# Patient Record
Sex: Male | Born: 1935
Health system: Southern US, Community
[De-identification: ages and names within clinical notes are randomized; demographics above are authoritative.]

## PROBLEM LIST (undated history)

## (undated) DIAGNOSIS — J449 Chronic obstructive pulmonary disease, unspecified: Secondary | ICD-10-CM

## (undated) DIAGNOSIS — J9621 Acute and chronic respiratory failure with hypoxia: Secondary | ICD-10-CM

## (undated) DIAGNOSIS — J690 Pneumonitis due to inhalation of food and vomit: Secondary | ICD-10-CM

## (undated) DIAGNOSIS — R079 Chest pain, unspecified: Secondary | ICD-10-CM

## (undated) DIAGNOSIS — K5641 Fecal impaction: Secondary | ICD-10-CM

## (undated) DIAGNOSIS — K529 Noninfective gastroenteritis and colitis, unspecified: Secondary | ICD-10-CM

## (undated) DIAGNOSIS — N183 Chronic kidney disease, stage 3 (moderate): Secondary | ICD-10-CM

## (undated) DIAGNOSIS — K838 Other specified diseases of biliary tract: Secondary | ICD-10-CM

## (undated) HISTORY — PX: PEG TUBE PLACEMENT: SUR1034

## (undated) HISTORY — PX: OTHER SURGICAL HISTORY: SHX169

---

## 2017-06-02 ENCOUNTER — Other Ambulatory Visit (HOSPITAL_COMMUNITY): Payer: Medicaid Other

## 2017-06-02 ENCOUNTER — Inpatient Hospital Stay
Admission: AC | Admit: 2017-06-02 | Discharge: 2017-07-03 | Disposition: A | Discharge status: Other Acute Inpatient Hospital | Payer: Medicaid Other | Source: Other Acute Inpatient Hospital | Attending: Urology | Admitting: Urology

## 2017-06-02 DIAGNOSIS — J449 Chronic obstructive pulmonary disease, unspecified: Secondary | ICD-10-CM

## 2017-06-02 DIAGNOSIS — J690 Pneumonitis due to inhalation of food and vomit: Secondary | ICD-10-CM

## 2017-06-02 DIAGNOSIS — J9600 Acute respiratory failure, unspecified whether with hypoxia or hypercapnia: Secondary | ICD-10-CM

## 2017-06-02 DIAGNOSIS — N183 Chronic kidney disease, stage 3 unspecified: Secondary | ICD-10-CM

## 2017-06-02 DIAGNOSIS — J9621 Acute and chronic respiratory failure with hypoxia: Secondary | ICD-10-CM

## 2017-06-02 DIAGNOSIS — R4182 Altered mental status, unspecified: Secondary | ICD-10-CM

## 2017-06-02 DIAGNOSIS — Z539 Procedure and treatment not carried out, unspecified reason: Secondary | ICD-10-CM

## 2017-06-02 DIAGNOSIS — J9690 Respiratory failure, unspecified, unspecified whether with hypoxia or hypercapnia: Secondary | ICD-10-CM

## 2017-06-02 DIAGNOSIS — Z431 Encounter for attention to gastrostomy: Secondary | ICD-10-CM

## 2017-06-02 HISTORY — DX: Fecal impaction: K56.41

## 2017-06-02 HISTORY — DX: Chronic obstructive pulmonary disease, unspecified: J44.9

## 2017-06-02 HISTORY — DX: Chest pain, unspecified: R07.9

## 2017-06-02 HISTORY — DX: Noninfective gastroenteritis and colitis, unspecified: K52.9

## 2017-06-02 HISTORY — DX: Acute and chronic respiratory failure with hypoxia: J96.21

## 2017-06-02 HISTORY — DX: Other specified diseases of biliary tract: K83.8

## 2017-06-02 HISTORY — DX: Chronic kidney disease, stage 3 (moderate): N18.3

## 2017-06-02 HISTORY — DX: Pneumonitis due to inhalation of food and vomit: J69.0

## 2017-06-02 LAB — BLOOD GAS, ARTERIAL
ACID-BASE EXCESS: 3.2 mmol/L — AB (ref 0.0–2.0)
BICARBONATE: 26.9 mmol/L (ref 20.0–28.0)
FIO2: 28
LHR: 24 {breaths}/min
O2 Saturation: 95.2 %
PATIENT TEMPERATURE: 98.6
PCO2 ART: 38.8 mmHg (ref 32.0–48.0)
PEEP/CPAP: 5 cmH2O
PO2 ART: 72.5 mmHg — AB (ref 83.0–108.0)
VT: 450 mL
pH, Arterial: 7.455 — ABNORMAL HIGH (ref 7.350–7.450)

## 2017-06-02 MED ORDER — IOPAMIDOL (ISOVUE-300) INJECTION 61%
INTRAVENOUS | Status: AC
  Filled 2017-06-02: qty 50

## 2017-06-03 ENCOUNTER — Other Ambulatory Visit (HOSPITAL_COMMUNITY): Payer: Medicaid Other

## 2017-06-03 ENCOUNTER — Encounter: Payer: Self-pay | Admitting: Internal Medicine

## 2017-06-03 DIAGNOSIS — N183 Chronic kidney disease, stage 3 unspecified: Secondary | ICD-10-CM

## 2017-06-03 DIAGNOSIS — J690 Pneumonitis due to inhalation of food and vomit: Secondary | ICD-10-CM | POA: Diagnosis not present

## 2017-06-03 DIAGNOSIS — J9621 Acute and chronic respiratory failure with hypoxia: Secondary | ICD-10-CM

## 2017-06-03 DIAGNOSIS — J449 Chronic obstructive pulmonary disease, unspecified: Secondary | ICD-10-CM

## 2017-06-03 HISTORY — DX: Chronic obstructive pulmonary disease, unspecified: J44.9

## 2017-06-03 HISTORY — DX: Pneumonitis due to inhalation of food and vomit: J69.0

## 2017-06-03 HISTORY — DX: Chronic kidney disease, stage 3 unspecified: N18.30

## 2017-06-03 HISTORY — DX: Acute and chronic respiratory failure with hypoxia: J96.21

## 2017-06-03 LAB — CBC WITH DIFFERENTIAL/PLATELET
BASOS PCT: 0 %
Band Neutrophils: 7 %
Basophils Absolute: 0 10*3/uL (ref 0.0–0.1)
Blasts: 0 %
EOS PCT: 1 %
Eosinophils Absolute: 0.1 10*3/uL (ref 0.0–0.7)
HCT: 22.6 % — ABNORMAL LOW (ref 39.0–52.0)
Hemoglobin: 7.3 g/dL — ABNORMAL LOW (ref 13.0–17.0)
LYMPHS ABS: 1.3 10*3/uL (ref 0.7–4.0)
LYMPHS PCT: 17 %
MCH: 28.9 pg (ref 26.0–34.0)
MCHC: 32.3 g/dL (ref 30.0–36.0)
MCV: 89.3 fL (ref 78.0–100.0)
MONO ABS: 0.3 10*3/uL (ref 0.1–1.0)
MONOS PCT: 4 %
MYELOCYTES: 1 %
Metamyelocytes Relative: 1 %
NEUTROS ABS: 5.7 10*3/uL (ref 1.7–7.7)
NEUTROS PCT: 69 %
NRBC: 0 /100{WBCs}
OTHER: 0 %
PLATELETS: 183 10*3/uL (ref 150–400)
Promyelocytes Relative: 0 %
RBC: 2.53 MIL/uL — AB (ref 4.22–5.81)
RDW: 15.3 % (ref 11.5–15.5)
WBC: 7.4 10*3/uL (ref 4.0–10.5)

## 2017-06-03 LAB — RENAL FUNCTION PANEL
ANION GAP: 10 (ref 5–15)
Albumin: 2.2 g/dL — ABNORMAL LOW (ref 3.5–5.0)
BUN: 44 mg/dL — ABNORMAL HIGH (ref 6–20)
CALCIUM: 8.1 mg/dL — AB (ref 8.9–10.3)
CO2: 27 mmol/L (ref 22–32)
Chloride: 98 mmol/L — ABNORMAL LOW (ref 101–111)
Creatinine, Ser: 3.15 mg/dL — ABNORMAL HIGH (ref 0.61–1.24)
GFR calc non Af Amer: 17 mL/min — ABNORMAL LOW (ref >60)
GFR, EST AFRICAN AMERICAN: 20 mL/min — AB (ref >60)
Glucose, Bld: 126 mg/dL — ABNORMAL HIGH (ref 65–99)
PHOSPHORUS: 5.1 mg/dL — AB (ref 2.5–4.6)
POTASSIUM: 3.3 mmol/L — AB (ref 3.5–5.1)
SODIUM: 135 mmol/L (ref 135–145)

## 2017-06-03 LAB — URINALYSIS, ROUTINE W REFLEX MICROSCOPIC
BILIRUBIN URINE: NEGATIVE
Glucose, UA: NEGATIVE mg/dL
Ketones, ur: NEGATIVE mg/dL
LEUKOCYTES UA: NEGATIVE
NITRITE: NEGATIVE
PROTEIN: 30 mg/dL — AB
SPECIFIC GRAVITY, URINE: 1.006 (ref 1.005–1.030)
pH: 6 (ref 5.0–8.0)

## 2017-06-03 LAB — CBC
HEMATOCRIT: 23.6 % — AB (ref 39.0–52.0)
HEMOGLOBIN: 7.5 g/dL — AB (ref 13.0–17.0)
MCH: 28.5 pg (ref 26.0–34.0)
MCHC: 31.8 g/dL (ref 30.0–36.0)
MCV: 89.7 fL (ref 78.0–100.0)
Platelets: 190 10*3/uL (ref 150–400)
RBC: 2.63 MIL/uL — AB (ref 4.22–5.81)
RDW: 15.6 % — ABNORMAL HIGH (ref 11.5–15.5)
WBC: 6.8 10*3/uL (ref 4.0–10.5)

## 2017-06-03 LAB — COMPREHENSIVE METABOLIC PANEL
ALT: 10 U/L — AB (ref 17–63)
AST: 22 U/L (ref 15–41)
Albumin: 2.2 g/dL — ABNORMAL LOW (ref 3.5–5.0)
Alkaline Phosphatase: 67 U/L (ref 38–126)
Anion gap: 14 (ref 5–15)
BUN: 42 mg/dL — ABNORMAL HIGH (ref 6–20)
CHLORIDE: 98 mmol/L — AB (ref 101–111)
CO2: 23 mmol/L (ref 22–32)
CREATININE: 3.02 mg/dL — AB (ref 0.61–1.24)
Calcium: 8 mg/dL — ABNORMAL LOW (ref 8.9–10.3)
GFR, EST AFRICAN AMERICAN: 21 mL/min — AB (ref >60)
GFR, EST NON AFRICAN AMERICAN: 18 mL/min — AB (ref >60)
Glucose, Bld: 104 mg/dL — ABNORMAL HIGH (ref 65–99)
POTASSIUM: 3.4 mmol/L — AB (ref 3.5–5.1)
SODIUM: 135 mmol/L (ref 135–145)
Total Bilirubin: 0.7 mg/dL (ref 0.3–1.2)
Total Protein: 5.3 g/dL — ABNORMAL LOW (ref 6.5–8.1)

## 2017-06-03 LAB — C DIFFICILE QUICK SCREEN W PCR REFLEX
C DIFFICLE (CDIFF) ANTIGEN: POSITIVE — AB
C Diff toxin: NEGATIVE

## 2017-06-03 LAB — CK TOTAL AND CKMB (NOT AT ARMC)
CK TOTAL: 17 U/L — AB (ref 49–397)
CK, MB: 0.7 ng/mL (ref 0.5–5.0)
Relative Index: INVALID (ref 0.0–2.5)

## 2017-06-03 LAB — CLOSTRIDIUM DIFFICILE BY PCR, REFLEXED: Toxigenic C. Difficile by PCR: NEGATIVE

## 2017-06-03 LAB — PROTIME-INR
INR: 1.17
PROTHROMBIN TIME: 14.8 s (ref 11.4–15.2)

## 2017-06-03 LAB — TROPONIN I

## 2017-06-03 LAB — MAGNESIUM: Magnesium: 1.9 mg/dL (ref 1.7–2.4)

## 2017-06-03 MED ORDER — NYSTATIN 100000 UNIT/ML MT SUSP
500000.00 | OROMUCOSAL | Status: DC
Start: 2017-06-02 — End: 2017-06-03

## 2017-06-03 MED ORDER — ASPIRIN 81 MG PO CHEW
81.00 | CHEWABLE_TABLET | ORAL | Status: DC
Start: 2017-06-03 — End: 2017-06-03

## 2017-06-03 MED ORDER — ALBUTEROL SULFATE (2.5 MG/3ML) 0.083% IN NEBU
2.50 | INHALATION_SOLUTION | RESPIRATORY_TRACT | Status: DC
Start: ? — End: 2017-06-03

## 2017-06-03 MED ORDER — GLUCOSE 40 % PO GEL
15.00 g | ORAL | Status: DC
Start: ? — End: 2017-06-03

## 2017-06-03 MED ORDER — DEXTROSE 50 % IV SOLN
12.00 g | INTRAVENOUS | Status: DC
Start: ? — End: 2017-06-03

## 2017-06-03 MED ORDER — GENERIC EXTERNAL MEDICATION
1.00 g | Status: DC
Start: 2017-06-02 — End: 2017-06-03

## 2017-06-03 MED ORDER — PANTOPRAZOLE 40 MG/20 ML SUSPENSION
40.00 | PACK | ORAL | Status: DC
Start: 2017-06-03 — End: 2017-06-03

## 2017-06-03 MED ORDER — TAMSULOSIN HCL 0.4 MG PO CAPS
.40 | ORAL_CAPSULE | ORAL | Status: DC
Start: 2017-06-03 — End: 2017-06-03

## 2017-06-03 MED ORDER — GENERIC EXTERNAL MEDICATION
1.00 | Status: DC
Start: 2017-06-03 — End: 2017-06-03

## 2017-06-03 MED ORDER — ACETAMINOPHEN 650 MG/20.3ML PO SOLN
650.00 | ORAL | Status: DC
Start: ? — End: 2017-06-03

## 2017-06-03 MED ORDER — NITROGLYCERIN 0.4 MG SL SUBL
0.40 | SUBLINGUAL_TABLET | SUBLINGUAL | Status: DC
Start: ? — End: 2017-06-03

## 2017-06-03 MED ORDER — GENERIC EXTERNAL MEDICATION
Status: DC
Start: 2017-06-03 — End: 2017-06-03

## 2017-06-03 MED ORDER — GENERIC EXTERNAL MEDICATION
10.00 | Status: DC
Start: 2017-06-02 — End: 2017-06-03

## 2017-06-03 MED ORDER — INSULIN LISPRO 100 UNIT/ML ~~LOC~~ SOLN
2.00 | SUBCUTANEOUS | Status: DC
Start: 2017-06-02 — End: 2017-06-03

## 2017-06-03 MED ORDER — MONTELUKAST SODIUM 10 MG PO TABS
10.00 | ORAL_TABLET | ORAL | Status: DC
Start: 2017-06-03 — End: 2017-06-03

## 2017-06-03 MED ORDER — HYDROCORTISONE NA SUCCINATE PF 100 MG IJ SOLR
50.00 | INTRAMUSCULAR | Status: DC
Start: 2017-06-03 — End: 2017-06-03

## 2017-06-03 MED ORDER — GENERIC EXTERNAL MEDICATION
1.00 | Status: DC
Start: ? — End: 2017-06-03

## 2017-06-03 MED ORDER — EPOETIN ALFA 20000 UNIT/ML IJ SOLN
20000.00 | INTRAMUSCULAR | Status: DC
Start: 2017-06-09 — End: 2017-06-03

## 2017-06-03 MED ORDER — GENERIC EXTERNAL MEDICATION
Status: DC
Start: ? — End: 2017-06-03

## 2017-06-03 MED ORDER — DEXTROSE 50 % IV SOLN
50.00 | INTRAVENOUS | Status: DC
Start: ? — End: 2017-06-03

## 2017-06-03 MED ORDER — NICOTINE 7 MG/24HR TD PT24
1.00 | MEDICATED_PATCH | TRANSDERMAL | Status: DC
Start: 2017-06-03 — End: 2017-06-03

## 2017-06-03 MED ORDER — MUPIROCIN 2 % EX OINT
TOPICAL_OINTMENT | CUTANEOUS | Status: DC
Start: 2017-06-03 — End: 2017-06-03

## 2017-06-03 NOTE — Consult Note (Signed)
[facility] CONSULT NOTE    Date: 06/03/2017                  Patient Name:  Mark Farrell  MRN: [id number]  DOB: [birth year]  Age / Sex: 83 y.o., male         PCP: No primary care provider on file.                 Service Requesting Consult: Hospitalist                 Reason for Consult: Acute renal failure requiring dialysis            History of Present Illness: Patient is a 83 y.o. male with a recent PMHx of acute renal failure on chronic kidney disease stage III requiring hemodialysis status post PermCath placement on June 02, 2017,  acute hypoxic respiratory failure status post tracheostomy placement May 29, 2017, dysphagia status post PEG, recurrent aspiration pneumonia, diabetes mellitus type 2, COPD, hypertension, who was admitted to [facility] on 06/02/2017 for ongoing management.  Patient was admitted to [facility] from April 28, 2017 to June 02, 2017.  Patient unable to offer any history at this point in time.  When he initially presented he was having falls at home.  He was also found to have colitis, aspiration pneumonia, and fecal impaction.  Currently he is awake and alert and will follow simple commands.  Overall he was felt to have a guarded prognosis long-term.  Immediately prior to his discharge he was still requiring antibiotic therapy with meropenem.  He has also been receiving tube feeds and has been tolerating these relatively well.  Medications: Medications upon discharge from [facility]:  . amino acids-protein hydrolysate Per G Tube Daily@0800   . aspirin 81 mg Per G Tube Daily@0900   . epoetin alfa 20,000 Units Subcutaneous Q7 Days  . hydrocortisone sodium succinate 50 mg Intravenous Q24H  . insulin glargine 10 Units Subcutaneous QPM  . insulin lispro 2-12 Units Subcutaneous 6 times per day  . lactobacillus rhamnosus (GG) 1 capsule Oral Daily@0900   . meropenem 1 g Intravenous Q24H  .  montelukast 10 mg Oral Daily@1800   . mupirocin Topical Daily@0900   . nicotine 1 patch Transdermal Daily@0900   . nystatin 500,000 Units Oral Q6H  . pantoprazole 40 mg Per G Tube Daily@0900   . tamsulosin 0.4 mg Oral Daily@0900      Allergies: No known drug allergies   Past Medical History: acute renal failure on chronic kidney disease stage III requiring hemodialysis status post PermCath placement on June 02, 2017,  acute hypoxic respiratory failure status post tracheostomy placement May 29, 2017, dysphagia status post PEG, recurrent aspiration pneumonia, diabetes mellitus type 2, COPD, hypertension,  Past Surgical History: Tracheostomy placement May 29, 2017 PermCath placement June 02, 2017 Status post PEG tube placement  Family History: Unable to obtain from the patient as he is currently on the ventilator.  Social History: Unable to obtain from the patient as he is currently on the ventilator.  Review of Systems: Unable to obtain from the patient as he is currently on the ventilator.In morning  Vital Signs: Temperature 97.6 pulse 93 respirations 24 blood pressure 105/52  Weight trends: There were no vitals filed for this visit.  Physical Exam: General: Chronically ill appearing  Head: Normocephalic, atraumatic.  Eyes: Anicteric, EOMI  Nose: Mucous membranes moist, not inflammed, nonerythematous.  Throat: Oropharynx nonerythematous, no exudate appreciated.   Neck: Tracheostomy  in place  Lungs:  Ventilator assisted,bilateral rhonchi  Heart: S1S2 no rubs  Abdomen:  Soft NTND BS present, PEG present  Extremities: trace pretibial edema.  Neurologic: Awake and alert, will follow commands  Skin: Scattered ecchymoses    Lab results: Basic Metabolic Panel: No results for input(s): NA, K, CL, CO2, GLUCOSE, BUN, CREATININE, CALCIUM, MG, PHOS in the last 168 hours.  Liver Function Tests: No results for input(s): AST, ALT, ALKPHOS, BILITOT, PROT, ALBUMIN in the last  168 hours. No results for input(s): LIPASE, AMYLASE in the last 168 hours. No results for input(s): AMMONIA in the last 168 hours.  CBC: Recent Labs  Lab 06/03/17 0532  WBC 7.4  NEUTROABS PENDING  HGB 7.3*  HCT 22.6*  MCV 89.3  PLT 183    Cardiac Enzymes: No results for input(s): CKTOTAL, CKMB, CKMBINDEX, TROPONINI in the last 168 hours.  BNP: Invalid input(s): POCBNP  CBG: No results for input(s): GLUCAP in the last 168 hours.  Microbiology: No results found for this or any previous visit.  Coagulation Studies: Recent Labs    06/03/17 0532  LABPROT 14.8  INR 1.17    Urinalysis: Recent Labs    06/03/17 0625  COLORURINE YELLOW  LABSPEC 1.006  PHURINE 6.0  GLUCOSEU NEGATIVE  HGBUR MODERATE*  BILIRUBINUR NEGATIVE  KETONESUR NEGATIVE  PROTEINUR 30*  NITRITE NEGATIVE  LEUKOCYTESUR NEGATIVE      Imaging: Dg Abdomen Peg Tube Location  Result Date: 06/02/2017 CLINICAL DATA:  PEG tube adjustment. EXAM: ABDOMEN - 1 VIEW COMPARISON:  None. FINDINGS: The patient was injected with 50 mL of Isovue-300 through the PEG tube. The PEG tube distal tip terminates over the gastric body. There is filling of the stomach, duodenum, and proximal jejunum. No evidence of leak. An IVC filter is identified. IMPRESSION: Peg tube placement as above.  No evidence of leak. Electronically Signed   By: Gerome Samavid  Williams III M.D   On: 06/02/2017 20:29      Assessment & Plan: Pt is a 83 y.o. male with a recent PMHx of acute renal failure on chronic kidney disease stage III requiring hemodialysis status post PermCath placement on June 02, 2017,  acute hypoxic respiratory failure status post tracheostomy placement May 29, 2017, dysphagia status post PEG, recurrent aspiration pneumonia, diabetes mellitus type 2, COPD, hypertension, who was admitted to [facility] on 06/02/2017 for ongoing management.    1.  Acute renal failure requiring hemodialysis. 2.  Chronic kidney disease stage  III baseline creatinine 1.3. 3.  Acute respiratory failure status post tracheostomy placement. 4.  Recurrent aspiration pneumonia. 5.  Anemia of chronic kidney disease.  Plan: The patient had a [facility].  We are asked to see him for continued management of his acute renal failure.  We will plan for hemodialysis today.  Thereafter we will maintain him on a Monday, Wednesday, Friday schedule.  He has a right internal jugular PermCath in place.  His hemoglobin is noted to be low at 7.3.  We will start the patient on Epogen 10,000 units IV with dialysis.  Treatment of aspiration pneumonia per hospitalist.  More than likely he will also be seen by neurology for weaning from the ventilator.  Overall prognosis is quite guarded.

## 2017-06-03 NOTE — Consult Note (Signed)
Pulmonary Critical Care Medicine [facility] PULMONARY SERVICE  Date of Service: 06/03/2017  PULMONARY CONSULT   Mark Farrell  XBM:[id number]  DOB: 08-Dec-1934   DOA: 06/02/2017  Referring Physician: Carron Curie, MD  HPI: Mark Farrell is a 83 y.o. male seen for follow up of Acute on Chronic Respiratory Failure.  Patient is transferred for further management.  Basically presented with history of falling history of aspiration pneumonia fecal impaction and colitis.  Patient was evaluated at the transferring facility started on broad-spectrum antibiotics and on the ventilator.  Patient was found to have pneumonia on x-ray was treated as such.  Had palliative care consultation however the family wanted to keep him as a full code at that time.  Patient had initially been tried on BiPAP however did not tolerate it.  Eventually had to be placed on the ventilator.  Comes to [place] on assist control mode and 28% oxygen.  Review of Systems:  ROS performed and is unremarkable other than noted above.  Past Medical History:  Diagnosis Date  . Acute on chronic respiratory failure with hypoxia (HCC) 06/03/2017  . Aspiration pneumonia of both lower lobes due to gastric secretions (HCC) 06/03/2017  . Chest pain   . CKD (chronic kidney disease), stage III (HCC) 06/03/2017  . Colitis   . COPD with chronic bronchitis (HCC) 06/03/2017  . Fecal impaction (HCC)   . Pneumobilia     Past Surgical History:  Procedure Laterality Date  . Hemodialysis catheter    . PEG TUBE PLACEMENT      Social History:    has an unknown smoking status. He has never used smokeless tobacco. He reports that he drank alcohol. His drug history is not on file.  Family History: Non-Contributory to the present illness  Allergies not on file  Medications: Reviewed on Rounds  Physical Exam:  Vitals: Temperature 97.6 pulse 93 respiratory rate 24 blood pressure 105/62 saturations 98%  Ventilator Settings of  ventilation assist control FiO2 28% tidal volume 465 PEEP 5  . General: Comfortable at this time . Eyes: Grossly normal lids, irises & conjunctiva . ENT: grossly tongue is normal . Neck: no obvious mass . Cardiovascular: S1-S2 normal no gallop or rub . Respiratory: Coarse breath sounds scattered rhonchi expansion is equal . Abdomen: Soft and nontender . Skin: no rash seen on limited exam . Musculoskeletal: not rigid . Psychiatric:unable to assess . Neurologic: no seizure no involuntary movements         Labs on Admission:  Basic Metabolic Panel: Recent Labs  Lab 06/03/17 0532 06/03/17 0757  NA 135 135  K 3.4* 3.3*  CL 98* 98*  CO2 23 27  GLUCOSE 104* 126*  BUN 42* 44*  CREATININE 3.02* 3.15*  CALCIUM 8.0* 8.1*  MG 1.9  --   PHOS  --  5.1*    Liver Function Tests: Recent Labs  Lab 06/03/17 0532 06/03/17 0757  AST 22  --   ALT 10*  --   ALKPHOS 67  --   BILITOT 0.7  --   PROT 5.3*  --   ALBUMIN 2.2* 2.2*   No results for input(s): LIPASE, AMYLASE in the last 168 hours. No results for input(s): AMMONIA in the last 168 hours.  CBC: Recent Labs  Lab 06/03/17 0532 06/03/17 0757  WBC 7.4 6.8  NEUTROABS 5.7  --   HGB 7.3* 7.5*  HCT 22.6* 23.6*  MCV 89.3 89.7  PLT 183 190    Cardiac Enzymes: No  results for input(s): CKTOTAL, CKMB, CKMBINDEX, TROPONINI in the last 168 hours.  BNP (last 3 results) No results for input(s): BNP in the last 8760 hours.  ProBNP (last 3 results) No results for input(s): PROBNP in the last 8760 hours.   Radiological Exams on Admission: Dg Abdomen Peg Tube Location  Result Date: 06/02/2017 CLINICAL DATA:  PEG tube adjustment. EXAM: ABDOMEN - 1 VIEW COMPARISON:  None. FINDINGS: The patient was injected with 50 mL of Isovue-300 through the PEG tube. The PEG tube distal tip terminates over the gastric body. There is filling of the stomach, duodenum, and proximal jejunum. No evidence of leak. An IVC filter is identified.  IMPRESSION: Peg tube placement as above.  No evidence of leak. Electronically Signed   By: Gerome Samavid  Williams III M.D   On: 06/02/2017 20:29   Dg Chest Port 1 View  Result Date: 06/03/2017 CLINICAL DATA:  Respiratory failure. EXAM: PORTABLE CHEST 1 VIEW COMPARISON:  05/30/2017 FINDINGS: Tracheostomy tube overlies the airway. Pre-existing right jugular catheter has been removed. A tunneled right jugular dialysis catheter has been placed and terminates over the high right atrium. Cardiomediastinal silhouette is within normal limits. Mild interstitial densities remain throughout both lungs. Residual patchy right basilar airspace opacities on the prior study have resolved. No sizable pleural effusion or pneumothorax is identified. IMPRESSION: 1. Resolution of residual right basilar airspace opacities. 2. Suspected mild residual interstitial edema. 3. Interval tunneled right jugular dialysis catheter placement. Electronically Signed   By: Sebastian AcheAllen  Grady M.D.   On: 06/03/2017 11:18    Assessment/Plan Active Problems:   Acute on chronic respiratory failure with hypoxia (HCC)   Aspiration pneumonia of both lower lobes due to gastric secretions (HCC)   CKD (chronic kidney disease), stage III (HCC)   COPD with chronic bronchitis (HCC)   1. Acute on chronic respiratory failure with hypoxia patient is on full vent support we will have respiratory assist the hours as needed mechanics and try to wean if possible.  We will continue with supportive care 2. Pneumonia due to aspiration treated with antibiotics we will continue to follow along 3. Chronic kidney disease stage III on dialysis nephrology consultation 4. Chronic obstructive pulmonary disease we will continue with present management  I have personally seen and evaluated the patient, evaluated laboratory and imaging results, formulated the assessment and plan and placed orders. The Patient requires high complexity decision making for assessment and support.   Case was discussed on Rounds with the Respiratory Therapy Staff Time Spent 70minutes  Yevonne PaxSaadat A Jarvis Knodel, MD [facility] Pulmonary Critical Care Medicine Sleep Medicine

## 2017-06-04 DIAGNOSIS — J449 Chronic obstructive pulmonary disease, unspecified: Secondary | ICD-10-CM | POA: Diagnosis not present

## 2017-06-04 DIAGNOSIS — J9621 Acute and chronic respiratory failure with hypoxia: Secondary | ICD-10-CM | POA: Diagnosis not present

## 2017-06-04 DIAGNOSIS — N183 Chronic kidney disease, stage 3 (moderate): Secondary | ICD-10-CM | POA: Diagnosis not present

## 2017-06-04 DIAGNOSIS — J690 Pneumonitis due to inhalation of food and vomit: Secondary | ICD-10-CM | POA: Diagnosis not present

## 2017-06-04 LAB — URINE CULTURE: Culture: NO GROWTH

## 2017-06-04 LAB — HEPATITIS B CORE ANTIBODY, TOTAL: Hep B Core Total Ab: NEGATIVE

## 2017-06-04 LAB — CBC
HEMATOCRIT: 23 % — AB (ref 39.0–52.0)
HEMOGLOBIN: 7.5 g/dL — AB (ref 13.0–17.0)
MCH: 28.8 pg (ref 26.0–34.0)
MCHC: 32.6 g/dL (ref 30.0–36.0)
MCV: 88.5 fL (ref 78.0–100.0)
Platelets: 220 10*3/uL (ref 150–400)
RBC: 2.6 MIL/uL — AB (ref 4.22–5.81)
RDW: 15.3 % (ref 11.5–15.5)
WBC: 9.9 10*3/uL (ref 4.0–10.5)

## 2017-06-04 LAB — RENAL FUNCTION PANEL
ANION GAP: 11 (ref 5–15)
Albumin: 2.1 g/dL — ABNORMAL LOW (ref 3.5–5.0)
BUN: 59 mg/dL — ABNORMAL HIGH (ref 6–20)
CALCIUM: 8.2 mg/dL — AB (ref 8.9–10.3)
CHLORIDE: 97 mmol/L — AB (ref 101–111)
CO2: 26 mmol/L (ref 22–32)
Creatinine, Ser: 3.83 mg/dL — ABNORMAL HIGH (ref 0.61–1.24)
GFR calc non Af Amer: 14 mL/min — ABNORMAL LOW (ref >60)
GFR, EST AFRICAN AMERICAN: 16 mL/min — AB (ref >60)
Glucose, Bld: 84 mg/dL (ref 65–99)
POTASSIUM: 3.5 mmol/L (ref 3.5–5.1)
Phosphorus: 4.9 mg/dL — ABNORMAL HIGH (ref 2.5–4.6)
SODIUM: 134 mmol/L — AB (ref 135–145)

## 2017-06-04 LAB — HEPATITIS B SURFACE ANTIGEN: HEP B S AG: NEGATIVE

## 2017-06-04 LAB — PARATHYROID HORMONE, INTACT (NO CA): PTH: 33 pg/mL (ref 15–65)

## 2017-06-04 LAB — POTASSIUM: Potassium: 3.7 mmol/L (ref 3.5–5.1)

## 2017-06-04 LAB — HEPATITIS B SURFACE ANTIBODY,QUALITATIVE: HEP B S AB: NONREACTIVE

## 2017-06-04 NOTE — Progress Notes (Signed)
Pulmonary Critical Care Medicine [facility]   PULMONARY SERVICE  PROGRESS NOTE  Date of Service: 06/04/2017  Mark Farrell  ZOX:[id number]  DOB: 10-04-1934   DOA: 06/02/2017  Referring Physician: Carron Curie, MD  HPI: Mark Farrell is a 83 y.o. male seen for follow up of Acute on Chronic Respiratory Failure. Patient is on full support right has been on assist-control mode 28% oxygen good saturations are noted.  Currently is on a peep of 5  Medications: Reviewed on Rounds  Physical Exam:  Vitals:  Temperature 98.0 degrees pulse 86 blood pressure 120/62 saturations 98%  Ventilator Settings  Mode of ventilation assist-control FiO2 28% tidal volume 450 peep 5  . General: Comfortable at this time . Eyes: Grossly normal lids, irises & conjunctiva . ENT: grossly tongue is normal . Neck: no obvious mass . Cardiovascular:  S1-S2 normal no gallop noted . Respiratory:  No rhonchi expansion is equal . Abdomen:  Soft nontender . Skin: no rash seen on limited exam . Musculoskeletal: not rigid . Psychiatric:unable to assess . Neurologic: no seizure no involuntary movements         Labs on Admission:  Basic Metabolic Panel: Recent Labs  Lab 06/03/17 0532 06/03/17 0757 06/04/17 0445 06/04/17 1019  NA 135 135  --  134*  K 3.4* 3.3* 3.7 3.5  CL 98* 98*  --  97*  CO2 23 27  --  26  GLUCOSE 104* 126*  --  84  BUN 42* 44*  --  59*  CREATININE 3.02* 3.15*  --  3.83*  CALCIUM 8.0* 8.1*  --  8.2*  MG 1.9  --   --   --   PHOS  --  5.1*  --  4.9*    Liver Function Tests: Recent Labs  Lab 06/03/17 0532 06/03/17 0757 06/04/17 1019  AST 22  --   --   ALT 10*  --   --   ALKPHOS 67  --   --   BILITOT 0.7  --   --   PROT 5.3*  --   --   ALBUMIN 2.2* 2.2* 2.1*   No results for input(s): LIPASE, AMYLASE in the last 168 hours. No results for input(s): AMMONIA in the last 168 hours.  CBC: Recent Labs  Lab 06/03/17 0532 06/03/17 0757 06/04/17 1019   WBC 7.4 6.8 9.9  NEUTROABS 5.7  --   --   HGB 7.3* 7.5* 7.5*  HCT 22.6* 23.6* 23.0*  MCV 89.3 89.7 88.5  PLT 183 190 220    Cardiac Enzymes: Recent Labs  Lab 06/03/17 1443  CKTOTAL 17*  CKMB 0.7  TROPONINI <0.03    BNP (last 3 results) No results for input(s): BNP in the last 8760 hours.  ProBNP (last 3 results) No results for input(s): PROBNP in the last 8760 hours.  Radiological Exams on Admission: Dg Abdomen Peg Tube Location  Result Date: 06/02/2017 CLINICAL DATA:  PEG tube adjustment. EXAM: ABDOMEN - 1 VIEW COMPARISON:  None. FINDINGS: The patient was injected with 50 mL of Isovue-300 through the PEG tube. The PEG tube distal tip terminates over the gastric body. There is filling of the stomach, duodenum, and proximal jejunum. No evidence of leak. An IVC filter is identified. IMPRESSION: Peg tube placement as above.  No evidence of leak. Electronically Signed   By: Gerome Sam III M.D   On: 06/02/2017 20:29   Dg Chest Port 1 View  Result Date: 06/03/2017 CLINICAL DATA:  Respiratory failure. EXAM: PORTABLE CHEST 1 VIEW COMPARISON:  05/30/2017 FINDINGS: Tracheostomy tube overlies the airway. Pre-existing right jugular catheter has been removed. A tunneled right jugular dialysis catheter has been placed and terminates over the high right atrium. Cardiomediastinal silhouette is within normal limits. Mild interstitial densities remain throughout both lungs. Residual patchy right basilar airspace opacities on the prior study have resolved. No sizable pleural effusion or pneumothorax is identified. IMPRESSION: 1. Resolution of residual right basilar airspace opacities. 2. Suspected mild residual interstitial edema. 3. Interval tunneled right jugular dialysis catheter placement. Electronically Signed   By: Sebastian AcheAllen  Grady M.D.   On: 06/03/2017 11:18    Assessment/Plan Active Problems:   Acute on chronic respiratory failure with hypoxia (HCC)   Aspiration pneumonia of both lower  lobes due to gastric secretions (HCC)   CKD (chronic kidney disease), stage III (HCC)   COPD with chronic bronchitis (HCC)   1.  acute on chronic Respiratory failure with hypoxia at this time patient will be started on wean protocol.  We will titrate rate down switch over to pressure support as tolerated continue with secretion management pulmonary toilet and follow along 2. Pneumonia secondary to aspiration treated with antibiotics we will continue with supportive care follow the x-rays.   3. Chronic kidney disease stage 3 follow labs and continue with supportive care 4. Chronic obstructive pulmonary disease at baseline right now seems to be stable Will monitor   I have personally seen and evaluated the patient, evaluated laboratory and imaging results, formulated the assessment and plan and placed orders. The Patient requires high complexity decision making for assessment and support.  Case was discussed on Rounds with the Respiratory Therapy Staff  Yevonne PaxSaadat A Khan, MD [employer] [facility] Pulmonary Critical Care Medicine Sleep Medicine

## 2017-06-05 DIAGNOSIS — J9621 Acute and chronic respiratory failure with hypoxia: Secondary | ICD-10-CM | POA: Diagnosis not present

## 2017-06-05 DIAGNOSIS — J449 Chronic obstructive pulmonary disease, unspecified: Secondary | ICD-10-CM | POA: Diagnosis not present

## 2017-06-05 DIAGNOSIS — N183 Chronic kidney disease, stage 3 (moderate): Secondary | ICD-10-CM | POA: Diagnosis not present

## 2017-06-05 DIAGNOSIS — J690 Pneumonitis due to inhalation of food and vomit: Secondary | ICD-10-CM | POA: Diagnosis not present

## 2017-06-05 LAB — RENAL FUNCTION PANEL
ALBUMIN: 2 g/dL — AB (ref 3.5–5.0)
ANION GAP: 11 (ref 5–15)
BUN: 37 mg/dL — AB (ref 6–20)
CO2: 25 mmol/L (ref 22–32)
Calcium: 8.2 mg/dL — ABNORMAL LOW (ref 8.9–10.3)
Chloride: 99 mmol/L — ABNORMAL LOW (ref 101–111)
Creatinine, Ser: 2.55 mg/dL — ABNORMAL HIGH (ref 0.61–1.24)
GFR calc Af Amer: 26 mL/min — ABNORMAL LOW (ref >60)
GFR calc non Af Amer: 23 mL/min — ABNORMAL LOW (ref >60)
GLUCOSE: 154 mg/dL — AB (ref 65–99)
PHOSPHORUS: 3.6 mg/dL (ref 2.5–4.6)
POTASSIUM: 3.4 mmol/L — AB (ref 3.5–5.1)
SODIUM: 135 mmol/L (ref 135–145)

## 2017-06-05 LAB — CBC
HEMATOCRIT: 23.8 % — AB (ref 39.0–52.0)
Hemoglobin: 7.5 g/dL — ABNORMAL LOW (ref 13.0–17.0)
MCH: 28.3 pg (ref 26.0–34.0)
MCHC: 31.5 g/dL (ref 30.0–36.0)
MCV: 89.8 fL (ref 78.0–100.0)
Platelets: 229 10*3/uL (ref 150–400)
RBC: 2.65 MIL/uL — ABNORMAL LOW (ref 4.22–5.81)
RDW: 15.8 % — AB (ref 11.5–15.5)
WBC: 5 10*3/uL (ref 4.0–10.5)

## 2017-06-05 NOTE — Progress Notes (Signed)
[facility]  ROUNDING NOTE   Subjective:   Moving to room 15  Hemodialysis yesterday. Tolerated treatment well. UF of 2 liters.   Hemodialysis for later today.   Objective:  Vital signs in last 24 hours:  BP: ()/()  Arterial Line BP: ()/()   Weight change:  There were no vitals filed for this visit.  Intake/Output: No intake/output data recorded.   Intake/Output this shift:  No intake/output data recorded.  Physical Exam: General: Ill appearing  Head: Normocephalic, atraumatic. Moist oral mucosal membranes  Eyes: Anicteric, PERRL  Neck: tracheostomy  Lungs:  Ronchi, vent A/C FiO2 35%  Heart: Regular rate and rhythm  Abdomen:  Soft, nontender, +PEG  Extremities: no peripheral edema.  Neurologic: Nonfocal, moving all four extremities  Skin: No lesions  Access: RIJ permcath    Basic Metabolic Panel: Recent Labs  Lab 06/03/17 0532 06/03/17 0757 06/04/17 0445 06/04/17 1019 06/05/17 0628  NA 135 135  --  134* 135  K 3.4* 3.3* 3.7 3.5 3.4*  CL 98* 98*  --  97* 99*  CO2 23 27  --  26 25  GLUCOSE 104* 126*  --  84 154*  BUN 42* 44*  --  59* 37*  CREATININE 3.02* 3.15*  --  3.83* 2.55*  CALCIUM 8.0* 8.1*  --  8.2* 8.2*  MG 1.9  --   --   --   --   PHOS  --  5.1*  --  4.9* 3.6    Liver Function Tests: Recent Labs  Lab 06/03/17 0532 06/03/17 0757 06/04/17 1019 06/05/17 0628  AST 22  --   --   --   ALT 10*  --   --   --   ALKPHOS 67  --   --   --   BILITOT 0.7  --   --   --   PROT 5.3*  --   --   --   ALBUMIN 2.2* 2.2* 2.1* 2.0*   No results for input(s): LIPASE, AMYLASE in the last 168 hours. No results for input(s): AMMONIA in the last 168 hours.  CBC: Recent Labs  Lab 06/03/17 0532 06/03/17 0757 06/04/17 1019 06/05/17 0628  WBC 7.4 6.8 9.9 5.0  NEUTROABS 5.7  --   --   --   HGB 7.3* 7.5* 7.5* 7.5*  HCT 22.6* 23.6* 23.0* 23.8*  MCV 89.3 89.7 88.5 89.8  PLT 183 190 220 229    Cardiac Enzymes: Recent Labs  Lab  06/03/17 1443  CKTOTAL 17*  CKMB 0.7  TROPONINI <0.03    BNP: Invalid input(s): POCBNP  CBG: No results for input(s): GLUCAP in the last 168 hours.  Microbiology: Results for orders placed or performed during the hospital encounter of 06/02/17  C difficile quick scan w PCR reflex     Status: Abnormal   Collection Time: 06/03/17  6:25 AM  Result Value Ref Range Status   C Diff antigen POSITIVE (A) NEGATIVE Final   C Diff toxin NEGATIVE NEGATIVE Final   C Diff interpretation Results are indeterminate. See PCR results.  Final    Comment: Performed at [facility], [address] [place], [place] [address]  Culture, Urine     Status: None   Collection Time: 06/03/17  6:25 AM  Result Value Ref Range Status   Specimen Description URINE, RANDOM  Final   Special Requests NONE  Final   Culture   Final    NO GROWTH Performed at [facility], [address] [place], [place] [address]    Report Status 06/04/2017 FINAL  Final  C. Diff by PCR, Reflexed     Status: None   Collection Time: 06/03/17  8:09 AM  Result Value Ref Range Status   Toxigenic C. Difficile by PCR NEGATIVE NEGATIVE Final    Comment: Patient is colonized with non toxigenic C. difficile. May not need treatment unless significant symptoms are present. Performed at [facility], [address] [place], [place] [address]   Culture, respiratory (NON-Expectorated)     Status: None (Preliminary result)   Collection Time: 06/04/17 10:15 AM  Result Value Ref Range Status   Specimen Description TRACHEAL ASPIRATE  Final   Special Requests NONE  Final   Gram Stain   Final    RARE WBC PRESENT, PREDOMINANTLY MONONUCLEAR RARE SQUAMOUS EPITHELIAL CELLS PRESENT NO ORGANISMS SEEN    Culture   Final    CULTURE REINCUBATED FOR BETTER GROWTH Performed at [facility], [address] [place], [place] [address]    Report Status PENDING  Incomplete    Coagulation Studies: Recent Labs     06/03/17 0532  LABPROT 14.8  INR 1.17    Urinalysis: Recent Labs    06/03/17 0625  COLORURINE YELLOW  LABSPEC 1.006  PHURINE 6.0  GLUCOSEU NEGATIVE  HGBUR MODERATE*  BILIRUBINUR NEGATIVE  KETONESUR NEGATIVE  PROTEINUR 30*  NITRITE NEGATIVE  LEUKOCYTESUR NEGATIVE      Imaging: No results found.   Medications:       Assessment/ Plan:  Pt is a 83 y.o. white male with a recent PMHx of acute renal failure on chronic kidney disease stage III requiring hemodialysis status post PermCath placement on June 02, 2017,  acute hypoxic respiratory failure status post tracheostomy placement May 29, 2017, dysphagia status post PEG, recurrent aspiration pneumonia, diabetes mellitus type 2, COPD, hypertension, who was admitted to [facility] on 06/02/2017 for ongoing management.   1.  Acute renal failure requiring hemodialysis. 2.  Chronic kidney disease stage III baseline creatinine 1.3. 3.  Acute respiratory failure status post tracheostomy placement. 4.  Recurrent aspiration pneumonia. 5.  Anemia of chronic kidney disease.  Plan: The patient had a [facility].  We are asked to see him for continued management of his acute renal failure.  We will plan for hemodialysis today.  Thereafter we will maintain him on a Monday, Wednesday, Friday schedule.   Epogen 10,000 units IV with dialysis.     LOS: 0 Hicks Feick 4/26/20194:11 PM

## 2017-06-05 NOTE — Progress Notes (Signed)
[facility] [place]   PULMONARY SERVICE  PROGRESS NOTE  Date of Service: 06/05/2017  Mark Farrell Hannen  ZOX:[id number]  DOB: [birth year]   DOA: 06/02/2017  Referring Physician: Carron CurieAli Hijazi, MD  HPI: Mark Farrell Strojny is a 83 y.o. male seen for follow up of Acute on Chronic Respiratory Failure.  Patient right now is on full support has been on assist control mode currently on 20% oxygen.  Medications: Reviewed on Rounds  Physical Exam:  Vitals: Temperature 97.6 pulse 59 respiratory rate 16 blood pressure 110/62 saturations 100%  Ventilator Settings mode of ventilation is assist control FiO2 28% tidal volume 431 PEEP 5  . General: Comfortable at this time . Eyes: Grossly normal lids, irises & conjunctiva . ENT: grossly tongue is normal . Neck: no obvious mass . Cardiovascular: S1-S2 normal no gallop . Respiratory: No rhonchi expansion is equal . Abdomen: Soft nondistended . Skin: no rash seen on limited exam . Musculoskeletal: not rigid . Psychiatric:unable to assess . Neurologic: no seizure no involuntary movements         Labs on Admission:  Basic Metabolic Panel: Recent Labs  Lab 06/03/17 0532 06/03/17 0757 06/04/17 0445 06/04/17 1019 06/05/17 0628  NA 135 135  --  134* 135  K 3.4* 3.3* 3.7 3.5 3.4*  CL 98* 98*  --  97* 99*  CO2 23 27  --  26 25  GLUCOSE 104* 126*  --  84 154*  BUN 42* 44*  --  59* 37*  CREATININE 3.02* 3.15*  --  3.83* 2.55*  CALCIUM 8.0* 8.1*  --  8.2* 8.2*  MG 1.9  --   --   --   --   PHOS  --  5.1*  --  4.9* 3.6    Liver Function Tests: Recent Labs  Lab 06/03/17 0532 06/03/17 0757 06/04/17 1019 06/05/17 0628  AST 22  --   --   --   ALT 10*  --   --   --   ALKPHOS 67  --   --   --   BILITOT 0.7  --   --   --   PROT 5.3*  --   --   --   ALBUMIN 2.2* 2.2* 2.1* 2.0*   No results for input(s): LIPASE, AMYLASE in the last 168 hours. No results for input(s): AMMONIA in the last 168  hours.  CBC: Recent Labs  Lab 06/03/17 0532 06/03/17 0757 06/04/17 1019 06/05/17 0628  WBC 7.4 6.8 9.9 5.0  NEUTROABS 5.7  --   --   --   HGB 7.3* 7.5* 7.5* 7.5*  HCT 22.6* 23.6* 23.0* 23.8*  MCV 89.3 89.7 88.5 89.8  PLT 183 190 220 229    Cardiac Enzymes: Recent Labs  Lab 06/03/17 1443  CKTOTAL 17*  CKMB 0.7  TROPONINI <0.03    BNP (last 3 results) No results for input(s): BNP in the last 8760 hours.  ProBNP (last 3 results) No results for input(s): PROBNP in the last 8760 hours.  Radiological Exams on Admission: Dg Abdomen Peg Tube Location  Result Date: 06/02/2017 CLINICAL DATA:  PEG tube adjustment. EXAM: ABDOMEN - 1 VIEW COMPARISON:  None. FINDINGS: The patient was injected with 50 mL of Isovue-300 through the PEG tube. The PEG tube distal tip terminates over the gastric body. There is filling of the stomach, duodenum, and proximal jejunum. No evidence of leak. An IVC filter is identified. IMPRESSION: Peg tube placement as above.  No evidence of leak. Electronically Signed   By: Gerome Sam III M.Farrell   On: 06/02/2017 20:29   Dg Chest Port 1 View  Result Date: 06/03/2017 CLINICAL DATA:  Respiratory failure. EXAM: PORTABLE CHEST 1 VIEW COMPARISON:  05/30/2017 FINDINGS: Tracheostomy tube overlies the airway. Pre-existing right jugular catheter has been removed. A tunneled right jugular dialysis catheter has been placed and terminates over the high right atrium. Cardiomediastinal silhouette is within normal limits. Mild interstitial densities remain throughout both lungs. Residual patchy right basilar airspace opacities on the prior study have resolved. No sizable pleural effusion or pneumothorax is identified. IMPRESSION: 1. Resolution of residual right basilar airspace opacities. 2. Suspected mild residual interstitial edema. 3. Interval tunneled right jugular dialysis catheter placement. Electronically Signed   By: Sebastian Ache M.Farrell.   On: 06/03/2017 11:18     Assessment/Plan Active Problems:   Acute on chronic respiratory failure with hypoxia (HCC)   Aspiration pneumonia of both lower lobes due to gastric secretions (HCC)   CKD (chronic kidney disease), stage III (HCC)   COPD with chronic bronchitis (HCC)   1. Acute on chronic respiratory failure with hypoxia patient remains on the ventilator and full supportive right now no weaning is being done at this time patient has not been able to tolerate so far. 2. Pneumonia due to aspiration currently treated with antibiotics we will be continuing to follow along 3. Chronic kidney disease stage III followed by nephrology consultation. 4. COPD advanced disease we will continue to monitor   I have personally seen and evaluated the patient, evaluated laboratory and imaging results, formulated the assessment and plan and placed orders. The Patient requires high complexity decision making for assessment and support.  Case was discussed on Rounds with the Respiratory Therapy Staff  Yevonne Pax, MD [facility] Pulmonary Critical Care Medicine Sleep Medicine

## 2017-06-06 DIAGNOSIS — J449 Chronic obstructive pulmonary disease, unspecified: Secondary | ICD-10-CM | POA: Diagnosis not present

## 2017-06-06 DIAGNOSIS — J9621 Acute and chronic respiratory failure with hypoxia: Secondary | ICD-10-CM | POA: Diagnosis not present

## 2017-06-06 DIAGNOSIS — J690 Pneumonitis due to inhalation of food and vomit: Secondary | ICD-10-CM | POA: Diagnosis not present

## 2017-06-06 DIAGNOSIS — N183 Chronic kidney disease, stage 3 (moderate): Secondary | ICD-10-CM | POA: Diagnosis not present

## 2017-06-06 LAB — CULTURE, RESPIRATORY W GRAM STAIN: Culture: NORMAL

## 2017-06-06 LAB — CULTURE, RESPIRATORY

## 2017-06-06 LAB — POTASSIUM: Potassium: 3.4 mmol/L — ABNORMAL LOW (ref 3.5–5.1)

## 2017-06-06 NOTE — Progress Notes (Signed)
Pulmonary Critical Care Medicine [facility] [place]   PULMONARY SERVICE  PROGRESS NOTE  Date of Service: 06/06/2017  Mark Farrell  ZOX:[id number]  DOB: March 14, 1934   DOA: 06/02/2017  Referring Physician: Carron Curie, MD  HPI: Mark Farrell is a 83 y.o. male seen for follow up of Acute on Chronic Respiratory Failure.  Currently is on full vent support patient's been on assist control mode with good volumes and PEEP of 5.  Medications: Reviewed on Rounds  Physical Exam:  Vitals: Temperature 97.6 pulse 72 respiratory rate 18 blood pressure 114/60 saturations 100%  Ventilator Settings mode of ventilation assist control FiO2 20% tidal volume 43 PEEP 5  . General: Comfortable at this time . Eyes: Grossly normal lids, irises & conjunctiva . ENT: grossly tongue is normal . Neck: no obvious mass . Cardiovascular: S1-S2 normal no gallop or rub . Respiratory: No rhonchi expansion is equal . Abdomen: Soft nontender . Skin: no rash seen on limited exam . Musculoskeletal: not rigid . Psychiatric:unable to assess . Neurologic: no seizure no involuntary movements         Labs on Admission:  Basic Metabolic Panel: Recent Labs  Lab 06/03/17 0532 06/03/17 0757 06/04/17 0445 06/04/17 1019 06/05/17 0628  NA 135 135  --  134* 135  K 3.4* 3.3* 3.7 3.5 3.4*  CL 98* 98*  --  97* 99*  CO2 23 27  --  26 25  GLUCOSE 104* 126*  --  84 154*  BUN 42* 44*  --  59* 37*  CREATININE 3.02* 3.15*  --  3.83* 2.55*  CALCIUM 8.0* 8.1*  --  8.2* 8.2*  MG 1.9  --   --   --   --   PHOS  --  5.1*  --  4.9* 3.6    Liver Function Tests: Recent Labs  Lab 06/03/17 0532 06/03/17 0757 06/04/17 1019 06/05/17 0628  AST 22  --   --   --   ALT 10*  --   --   --   ALKPHOS 67  --   --   --   BILITOT 0.7  --   --   --   PROT 5.3*  --   --   --   ALBUMIN 2.2* 2.2* 2.1* 2.0*   No results for input(s): LIPASE, AMYLASE in the last 168 hours. No results for input(s): AMMONIA in the last  168 hours.  CBC: Recent Labs  Lab 06/03/17 0532 06/03/17 0757 06/04/17 1019 06/05/17 0628  WBC 7.4 6.8 9.9 5.0  NEUTROABS 5.7  --   --   --   HGB 7.3* 7.5* 7.5* 7.5*  HCT 22.6* 23.6* 23.0* 23.8*  MCV 89.3 89.7 88.5 89.8  PLT 183 190 220 229    Cardiac Enzymes: Recent Labs  Lab 06/03/17 1443  CKTOTAL 17*  CKMB 0.7  TROPONINI <0.03    BNP (last 3 results) No results for input(s): BNP in the last 8760 hours.  ProBNP (last 3 results) No results for input(s): PROBNP in the last 8760 hours.  Radiological Exams on Admission: Dg Abdomen Peg Tube Location  Result Date: 06/02/2017 CLINICAL DATA:  PEG tube adjustment. EXAM: ABDOMEN - 1 VIEW COMPARISON:  None. FINDINGS: The patient was injected with 50 mL of Isovue-300 through the PEG tube. The PEG tube distal tip terminates over the gastric body. There is filling of the stomach, duodenum, and proximal jejunum. No evidence of leak. An IVC filter is identified. IMPRESSION: Peg tube placement  as above.  No evidence of leak. Electronically Signed   By: Gerome Sam III M.D   On: 06/02/2017 20:29   Dg Chest Port 1 View  Result Date: 06/03/2017 CLINICAL DATA:  Respiratory failure. EXAM: PORTABLE CHEST 1 VIEW COMPARISON:  05/30/2017 FINDINGS: Tracheostomy tube overlies the airway. Pre-existing right jugular catheter has been removed. A tunneled right jugular dialysis catheter has been placed and terminates over the high right atrium. Cardiomediastinal silhouette is within normal limits. Mild interstitial densities remain throughout both lungs. Residual patchy right basilar airspace opacities on the prior study have resolved. No sizable pleural effusion or pneumothorax is identified. IMPRESSION: 1. Resolution of residual right basilar airspace opacities. 2. Suspected mild residual interstitial edema. 3. Interval tunneled right jugular dialysis catheter placement. Electronically Signed   By: Sebastian Ache M.D.   On: 06/03/2017 11:18     Assessment/Plan Active Problems:   Acute on chronic respiratory failure with hypoxia (HCC)   Aspiration pneumonia of both lower lobes due to gastric secretions (HCC)   CKD (chronic kidney disease), stage III (HCC)   COPD with chronic bronchitis (HCC)   1. Acute on chronic respiratory failure with hypoxia patient is doing well at this time and so therefore we should check the our SBI and try to start weaning.  The last radiological study showed improvement 2. Pneumonia due to aspiration showing some improvement on the last scan 3. Chronic kidney disease stage III followed by nephrology continue with supportive care 4. COPD with bronchitis at baseline   I have personally seen and evaluated the patient, evaluated laboratory and imaging results, formulated the assessment and plan and placed orders. The Patient requires high complexity decision making for assessment and support.  Case was discussed on Rounds with the Respiratory Therapy Staff  Yevonne Pax, MD [facility] Pulmonary Critical Care Medicine Sleep Medicine

## 2017-06-07 DIAGNOSIS — J449 Chronic obstructive pulmonary disease, unspecified: Secondary | ICD-10-CM | POA: Diagnosis not present

## 2017-06-07 DIAGNOSIS — N183 Chronic kidney disease, stage 3 (moderate): Secondary | ICD-10-CM | POA: Diagnosis not present

## 2017-06-07 DIAGNOSIS — J690 Pneumonitis due to inhalation of food and vomit: Secondary | ICD-10-CM | POA: Diagnosis not present

## 2017-06-07 DIAGNOSIS — J9621 Acute and chronic respiratory failure with hypoxia: Secondary | ICD-10-CM | POA: Diagnosis not present

## 2017-06-07 LAB — POTASSIUM: POTASSIUM: 4 mmol/L (ref 3.5–5.1)

## 2017-06-07 LAB — HEPARIN INDUCED PLATELET AB (HIT ANTIBODY): Heparin Induced Plt Ab: 0.387 OD (ref 0.000–0.400)

## 2017-06-07 NOTE — Progress Notes (Signed)
Pulmonary Critical Care Medicine [facility] [place]   PULMONARY SERVICE  PROGRESS NOTE  Date of Service: 06/07/2017  Mark Farrell  JWJ:[id number]  DOB: 1935-01-05   DOA: 06/02/2017  Referring Physician: Carron Curie, MD  HPI: Mark Farrell is a 83 y.o. male seen for follow up of Acute on Chronic Respiratory Failure.  Patient remains on full vent support was attempted on pressure support but failed.  Right now is comfortable without distress  Medications: Reviewed on Rounds  Physical Exam:  Vitals: Temperature 98.5 pulse 72 respiratory rate 12 blood pressure 96/84 saturations 99%  Ventilator Settings mode of ventilation assist control FiO2 28% tidal volume 463 PEEP 5  . General: Comfortable at this time . Eyes: Grossly normal lids, irises & conjunctiva . ENT: grossly tongue is normal . Neck: no obvious mass . Cardiovascular: S1-S2 normal no gallop or rub . Respiratory: No rhonchi expansion equal . Abdomen: Soft nondistended . Skin: no rash seen on limited exam . Musculoskeletal: not rigid . Psychiatric:unable to assess . Neurologic: no seizure no involuntary movements         Labs on Admission:  Basic Metabolic Panel: Recent Labs  Lab 06/03/17 0532 06/03/17 0757 06/04/17 0445 06/04/17 1019 06/05/17 0628 06/06/17 0751 06/07/17 1027  NA 135 135  --  134* 135  --   --   K 3.4* 3.3* 3.7 3.5 3.4* 3.4* 4.0  CL 98* 98*  --  97* 99*  --   --   CO2 23 27  --  26 25  --   --   GLUCOSE 104* 126*  --  84 154*  --   --   BUN 42* 44*  --  59* 37*  --   --   CREATININE 3.02* 3.15*  --  3.83* 2.55*  --   --   CALCIUM 8.0* 8.1*  --  8.2* 8.2*  --   --   MG 1.9  --   --   --   --   --   --   PHOS  --  5.1*  --  4.9* 3.6  --   --     Liver Function Tests: Recent Labs  Lab 06/03/17 0532 06/03/17 0757 06/04/17 1019 06/05/17 0628  AST 22  --   --   --   ALT 10*  --   --   --   ALKPHOS 67  --   --   --   BILITOT 0.7  --   --   --   PROT 5.3*  --   --    --   ALBUMIN 2.2* 2.2* 2.1* 2.0*   No results for input(s): LIPASE, AMYLASE in the last 168 hours. No results for input(s): AMMONIA in the last 168 hours.  CBC: Recent Labs  Lab 06/03/17 0532 06/03/17 0757 06/04/17 1019 06/05/17 0628  WBC 7.4 6.8 9.9 5.0  NEUTROABS 5.7  --   --   --   HGB 7.3* 7.5* 7.5* 7.5*  HCT 22.6* 23.6* 23.0* 23.8*  MCV 89.3 89.7 88.5 89.8  PLT 183 190 220 229    Cardiac Enzymes: Recent Labs  Lab 06/03/17 1443  CKTOTAL 17*  CKMB 0.7  TROPONINI <0.03    BNP (last 3 results) No results for input(s): BNP in the last 8760 hours.  ProBNP (last 3 results) No results for input(s): PROBNP in the last 8760 hours.  Radiological Exams on Admission: No results found.  Assessment/Plan Active Problems:   Acute  on chronic respiratory failure with hypoxia (HCC)   Aspiration pneumonia of both lower lobes due to gastric secretions (HCC)   CKD (chronic kidney disease), stage III (HCC)   COPD with chronic bronchitis (HCC)   1. Acute on chronic respiratory failure with hypoxia we will continue to assist in our SBI currently is not ready for weaning will continue with secretion management pulmonary toilet 2. Pneumonia due to aspiration treated with antibiotics follow x-rays as necessary. 3. Chronic kidney disease stage III followed by nephrology will continue with supportive care 4. COPD advanced disease we will continue to monitor   I have personally seen and evaluated the patient, evaluated laboratory and imaging results, formulated the assessment and plan and placed orders. The Patient requires high complexity decision making for assessment and support.  Case was discussed on Rounds with the Respiratory Therapy Staff  Yevonne Pax, MD [facility] Pulmonary Critical Care Medicine Sleep Medicine

## 2017-06-08 ENCOUNTER — Other Ambulatory Visit (HOSPITAL_COMMUNITY): Payer: Medicaid Other

## 2017-06-08 DIAGNOSIS — N183 Chronic kidney disease, stage 3 (moderate): Secondary | ICD-10-CM | POA: Diagnosis not present

## 2017-06-08 DIAGNOSIS — J449 Chronic obstructive pulmonary disease, unspecified: Secondary | ICD-10-CM | POA: Diagnosis not present

## 2017-06-08 DIAGNOSIS — J9621 Acute and chronic respiratory failure with hypoxia: Secondary | ICD-10-CM | POA: Diagnosis not present

## 2017-06-08 DIAGNOSIS — J690 Pneumonitis due to inhalation of food and vomit: Secondary | ICD-10-CM | POA: Diagnosis not present

## 2017-06-08 LAB — RENAL FUNCTION PANEL
ALBUMIN: 1.8 g/dL — AB (ref 3.5–5.0)
ANION GAP: 10 (ref 5–15)
BUN: 53 mg/dL — ABNORMAL HIGH (ref 6–20)
CALCIUM: 8.2 mg/dL — AB (ref 8.9–10.3)
CO2: 29 mmol/L (ref 22–32)
CREATININE: 3.15 mg/dL — AB (ref 0.61–1.24)
Chloride: 95 mmol/L — ABNORMAL LOW (ref 101–111)
GFR calc non Af Amer: 17 mL/min — ABNORMAL LOW (ref >60)
GFR, EST AFRICAN AMERICAN: 20 mL/min — AB (ref >60)
GLUCOSE: 89 mg/dL (ref 65–99)
PHOSPHORUS: 2.6 mg/dL (ref 2.5–4.6)
Potassium: 4.7 mmol/L (ref 3.5–5.1)
SODIUM: 134 mmol/L — AB (ref 135–145)

## 2017-06-08 LAB — CBC
HCT: 22.5 % — ABNORMAL LOW (ref 39.0–52.0)
HEMOGLOBIN: 7 g/dL — AB (ref 13.0–17.0)
MCH: 28.3 pg (ref 26.0–34.0)
MCHC: 31.1 g/dL (ref 30.0–36.0)
MCV: 91.1 fL (ref 78.0–100.0)
PLATELETS: 301 10*3/uL (ref 150–400)
RBC: 2.47 MIL/uL — AB (ref 4.22–5.81)
RDW: 15.6 % — ABNORMAL HIGH (ref 11.5–15.5)
WBC: 9.3 10*3/uL (ref 4.0–10.5)

## 2017-06-08 NOTE — Progress Notes (Signed)
Pulmonary Critical Care Medicine [facility] [place]   PULMONARY SERVICE  PROGRESS NOTE  Date of Service: 06/08/2017  Mark Farrell  ZOX:[id number]  DOB: May 07, 1934   DOA: 06/02/2017  Referring Physician: Carron Curie, MD  HPI: Mark Farrell is a 83 y.o. male seen for follow up of Acute on Chronic Respiratory Failure.  Patient was attempted on weaning on pressure support did not tolerate.  Right now is in assist control mode  Medications: Reviewed on Rounds  Physical Exam:  Vitals: Temperature 98.4 pulse 77 respiratory rate 18 blood pressure 105/86 saturations 100%  Ventilator Settings mode of ventilation assist control FiO2 20% tidal volume 500 PEEP 5  . General: Comfortable at this time . Eyes: Grossly normal lids, irises & conjunctiva . ENT: grossly tongue is normal . Neck: no obvious mass . Cardiovascular: S1-S2 normal no gallop . Respiratory: No rhonchi . Abdomen: Soft nondistended . Skin: no rash seen on limited exam . Musculoskeletal: not rigid . Psychiatric:unable to assess . Neurologic: no seizure no involuntary movements         Labs on Admission:  Basic Metabolic Panel: Recent Labs  Lab 06/03/17 0532 06/03/17 0757  06/04/17 1019 06/05/17 0628 06/06/17 0751 06/07/17 1027 06/08/17 0809  NA 135 135  --  134* 135  --   --  134*  K 3.4* 3.3*   < > 3.5 3.4* 3.4* 4.0 4.7  CL 98* 98*  --  97* 99*  --   --  95*  CO2 23 27  --  26 25  --   --  29  GLUCOSE 104* 126*  --  84 154*  --   --  89  BUN 42* 44*  --  59* 37*  --   --  53*  CREATININE 3.02* 3.15*  --  3.83* 2.55*  --   --  3.15*  CALCIUM 8.0* 8.1*  --  8.2* 8.2*  --   --  8.2*  MG 1.9  --   --   --   --   --   --   --   PHOS  --  5.1*  --  4.9* 3.6  --   --  2.6   < > = values in this interval not displayed.    Liver Function Tests: Recent Labs  Lab 06/03/17 0532 06/03/17 0757 06/04/17 1019 06/05/17 0628 06/08/17 0809  AST 22  --   --   --   --   ALT 10*  --   --   --   --    ALKPHOS 67  --   --   --   --   BILITOT 0.7  --   --   --   --   PROT 5.3*  --   --   --   --   ALBUMIN 2.2* 2.2* 2.1* 2.0* 1.8*   No results for input(s): LIPASE, AMYLASE in the last 168 hours. No results for input(s): AMMONIA in the last 168 hours.  CBC: Recent Labs  Lab 06/03/17 0532 06/03/17 0757 06/04/17 1019 06/05/17 0628 06/08/17 0809  WBC 7.4 6.8 9.9 5.0 9.3  NEUTROABS 5.7  --   --   --   --   HGB 7.3* 7.5* 7.5* 7.5* 7.0*  HCT 22.6* 23.6* 23.0* 23.8* 22.5*  MCV 89.3 89.7 88.5 89.8 91.1  PLT 183 190 220 229 301    Cardiac Enzymes: Recent Labs  Lab 06/03/17 1443  CKTOTAL 17*  CKMB 0.7  TROPONINI <0.03    BNP (last 3 results) No results for input(s): BNP in the last 8760 hours.  ProBNP (last 3 results) No results for input(s): PROBNP in the last 8760 hours.  Radiological Exams on Admission: No results found.  Assessment/Plan Active Problems:   Acute on chronic respiratory failure with hypoxia (HCC)   Aspiration pneumonia of both lower lobes due to gastric secretions (HCC)   CKD (chronic kidney disease), stage III (HCC)   COPD with chronic bronchitis (HCC)   1. Acute on chronic respiratory failure with hypoxia right now is on full vent support assist control patient has failed pressure support attempt as already mentioned we will continue to assess the our SBI 2. Chronic kidney disease stage III following by nephrology will continue with the recommendations 3. COPD severe disease at baseline 4. Aspiration pneumonia treated with antibiotics we will continue to monitor   I have personally seen and evaluated the patient, evaluated laboratory and imaging results, formulated the assessment and plan and placed orders. The Patient requires high complexity decision making for assessment and support.  Case was discussed on Rounds with the Respiratory Therapy Staff  Yevonne Pax, MD [facility] Pulmonary Critical Care Medicine Sleep Medicine

## 2017-06-08 NOTE — Progress Notes (Signed)
[facility]  ROUNDING NOTE   Subjective:  Patient seen and evaluated during hemodialysis. Appears to be tolerating well.    Objective:  Vital signs in last 24 hours:  Temperature 98.4 pulse 77 respirations 18 blood pressure 105/56  Physical Exam: General: Ill appearing  Head: Normocephalic, atraumatic. Moist oral mucosal membranes  Eyes: Anicteric  Neck: tracheostomy  Lungs:  Rhonchi, vent assisted  Heart: S1S2 no rubs  Abdomen:  Soft, nontender, +PEG  Extremities: no peripheral edema.  Neurologic: arousable but not following commands  Skin: No lesions  Access: RIJ permcath    Basic Metabolic Panel: Recent Labs  Lab 06/03/17 0532 06/03/17 0757  06/04/17 1019 06/05/17 0628 06/06/17 0751 06/07/17 1027 06/08/17 0809  NA 135 135  --  134* 135  --   --  134*  K 3.4* 3.3*   < > 3.5 3.4* 3.4* 4.0 4.7  CL 98* 98*  --  97* 99*  --   --  95*  CO2 23 27  --  26 25  --   --  29  GLUCOSE 104* 126*  --  84 154*  --   --  89  BUN 42* 44*  --  59* 37*  --   --  53*  CREATININE 3.02* 3.15*  --  3.83* 2.55*  --   --  3.15*  CALCIUM 8.0* 8.1*  --  8.2* 8.2*  --   --  8.2*  MG 1.9  --   --   --   --   --   --   --   PHOS  --  5.1*  --  4.9* 3.6  --   --  2.6   < > = values in this interval not displayed.    Liver Function Tests: Recent Labs  Lab 06/03/17 0532 06/03/17 0757 06/04/17 1019 06/05/17 0628 06/08/17 0809  AST 22  --   --   --   --   ALT 10*  --   --   --   --   ALKPHOS 67  --   --   --   --   BILITOT 0.7  --   --   --   --   PROT 5.3*  --   --   --   --   ALBUMIN 2.2* 2.2* 2.1* 2.0* 1.8*   No results for input(s): LIPASE, AMYLASE in the last 168 hours. No results for input(s): AMMONIA in the last 168 hours.  CBC: Recent Labs  Lab 06/03/17 0532 06/03/17 0757 06/04/17 1019 06/05/17 0628 06/08/17 0809  WBC 7.4 6.8 9.9 5.0 9.3  NEUTROABS 5.7  --   --   --   --   HGB 7.3* 7.5* 7.5* 7.5* 7.0*  HCT 22.6* 23.6* 23.0* 23.8* 22.5*  MCV 89.3  89.7 88.5 89.8 91.1  PLT 183 190 220 229 301    Cardiac Enzymes: Recent Labs  Lab 06/03/17 1443  CKTOTAL 17*  CKMB 0.7  TROPONINI <0.03    BNP: Invalid input(s): POCBNP  CBG: No results for input(s): GLUCAP in the last 168 hours.  Microbiology: Results for orders placed or performed during the hospital encounter of 06/02/17  C difficile quick scan w PCR reflex     Status: Abnormal   Collection Time: 06/03/17  6:25 AM  Result Value Ref Range Status   C Diff antigen POSITIVE (A) NEGATIVE Final   C Diff toxin NEGATIVE NEGATIVE Final   C Diff interpretation Results are indeterminate.  See PCR results.  Final    Comment: Performed at [facility], [address] [place], [place] [address]  Culture, Urine     Status: None   Collection Time: 06/03/17  6:25 AM  Result Value Ref Range Status   Specimen Description URINE, RANDOM  Final   Special Requests NONE  Final   Culture   Final    NO GROWTH Performed at [facility], [address], [place], [place] [address]    Report Status 06/04/2017 FINAL  Final  C. Diff by PCR, Reflexed     Status: None   Collection Time: 06/03/17  8:09 AM  Result Value Ref Range Status   Toxigenic C. Difficile by PCR NEGATIVE NEGATIVE Final    Comment: Patient is colonized with non toxigenic C. difficile. May not need treatment unless significant symptoms are present. Performed at [facility], [address], [place], [place] [address]   Culture, respiratory (NON-Expectorated)     Status: None   Collection Time: 06/04/17 10:15 AM  Result Value Ref Range Status   Specimen Description TRACHEAL ASPIRATE  Final   Special Requests NONE  Final   Gram Stain   Final    RARE WBC PRESENT, PREDOMINANTLY MONONUCLEAR RARE SQUAMOUS EPITHELIAL CELLS PRESENT NO ORGANISMS SEEN    Culture   Final    Consistent with normal respiratory flora. Performed at [facility], [address], [place], [place] [address]    Report Status  06/06/2017 FINAL  Final    Coagulation Studies: No results for input(s): LABPROT, INR in the last 72 hours.  Urinalysis: No results for input(s): COLORURINE, LABSPEC, PHURINE, GLUCOSEU, HGBUR, BILIRUBINUR, KETONESUR, PROTEINUR, UROBILINOGEN, NITRITE, LEUKOCYTESUR in the last 72 hours.  Invalid input(s): APPERANCEUR    Imaging: Dg Chest Port 1 View  Result Date: 06/08/2017 CLINICAL DATA:  Acute respiratory failure. EXAM: PORTABLE CHEST 1 VIEW COMPARISON:  Radiograph of June 03, 2017. FINDINGS: The heart size and mediastinal contours are within normal limits. Tracheostomy tube is in good position. Right internal jugular dialysis catheter is unchanged. No pneumothorax or pleural effusion is noted. Mild bibasilar interstitial densities are noted concerning for edema or possibly subsegmental atelectasis. The visualized skeletal structures are unremarkable. IMPRESSION: Mild bibasilar edema or subsegmental atelectasis. Electronically Signed   By: Lupita Raider, M.D.   On: 06/08/2017 09:40     Medications:       Assessment/ Plan:  Pt is a 83 y.o. white male with a recent PMHx of acute renal failure on chronic kidney disease stage III requiring hemodialysis status post PermCath placement on June 02, 2017,  acute hypoxic respiratory failure status post tracheostomy placement May 29, 2017, dysphagia status post PEG, recurrent aspiration pneumonia, diabetes mellitus type 2, COPD, hypertension, who was admitted to [facility] on 06/02/2017 for ongoing management.   1.  Acute renal failure requiring hemodialysis. 2.  Chronic kidney disease stage III baseline creatinine 1.3. 3.  Acute respiratory failure status post tracheostomy placement. 4.  Recurrent aspiration pneumonia. 5.  Anemia of chronic kidney disease.  Plan: Patient seen and evaluated during hemodialysis.  Appears to be tolerating well.  Plan to complete dialysis treatment today.  For the moment the patient remains dialysis  dependent at this time.  Next dialysis treatment scheduled for Wednesday.   LOS: 0 Aleece Loyd 4/29/20193:16 PM

## 2017-06-09 DIAGNOSIS — J449 Chronic obstructive pulmonary disease, unspecified: Secondary | ICD-10-CM | POA: Diagnosis not present

## 2017-06-09 DIAGNOSIS — J9621 Acute and chronic respiratory failure with hypoxia: Secondary | ICD-10-CM | POA: Diagnosis not present

## 2017-06-09 DIAGNOSIS — J690 Pneumonitis due to inhalation of food and vomit: Secondary | ICD-10-CM | POA: Diagnosis not present

## 2017-06-09 DIAGNOSIS — N183 Chronic kidney disease, stage 3 (moderate): Secondary | ICD-10-CM | POA: Diagnosis not present

## 2017-06-09 LAB — C DIFFICILE QUICK SCREEN W PCR REFLEX
C Diff antigen: POSITIVE — AB
C Diff toxin: NEGATIVE

## 2017-06-09 LAB — CLOSTRIDIUM DIFFICILE BY PCR, REFLEXED: CDIFFPCR: NEGATIVE

## 2017-06-09 NOTE — Progress Notes (Signed)
Pulmonary Critical Care Medicine [facility] [place]   PULMONARY SERVICE  PROGRESS NOTE  Date of Service: 06/09/2017  Mark Farrell  ZOX:[id number]  DOB: 07-24-1934   DOA: 06/02/2017  Referring Physician: Carron Curie, MD  HPI: Mark Farrell is a 82 y.o. male seen for follow up of Acute on Chronic Respiratory Failure.  Patient was attempted on pressure support and failed  Medications: Reviewed on Rounds  Physical Exam:  Vitals: Temperature 98.4 pulse 82 respiratory 21 blood pressure 118/62 saturations 100%  Ventilator Settings mode of ventilation assist control FiO2 20% tidal volume 443 PEEP 5  . General: Comfortable at this time . Eyes: Grossly normal lids, irises & conjunctiva . ENT: grossly tongue is normal . Neck: no obvious mass . Cardiovascular: S1-S2 normal no gallop rub . Respiratory: No rhonchi . Abdomen: Soft nontender . Skin: no rash seen on limited exam . Musculoskeletal: not rigid . Psychiatric:unable to assess . Neurologic: no seizure no involuntary movements         Labs on Admission:  Basic Metabolic Panel: Recent Labs  Lab 06/03/17 0532 06/03/17 0757  06/04/17 1019 06/05/17 0628 06/06/17 0751 06/07/17 1027 06/08/17 0809  NA 135 135  --  134* 135  --   --  134*  K 3.4* 3.3*   < > 3.5 3.4* 3.4* 4.0 4.7  CL 98* 98*  --  97* 99*  --   --  95*  CO2 23 27  --  26 25  --   --  29  GLUCOSE 104* 126*  --  84 154*  --   --  89  BUN 42* 44*  --  59* 37*  --   --  53*  CREATININE 3.02* 3.15*  --  3.83* 2.55*  --   --  3.15*  CALCIUM 8.0* 8.1*  --  8.2* 8.2*  --   --  8.2*  MG 1.9  --   --   --   --   --   --   --   PHOS  --  5.1*  --  4.9* 3.6  --   --  2.6   < > = values in this interval not displayed.    Liver Function Tests: Recent Labs  Lab 06/03/17 0532 06/03/17 0757 06/04/17 1019 06/05/17 0628 06/08/17 0809  AST 22  --   --   --   --   ALT 10*  --   --   --   --   ALKPHOS 67  --   --   --   --   BILITOT 0.7  --   --    --   --   PROT 5.3*  --   --   --   --   ALBUMIN 2.2* 2.2* 2.1* 2.0* 1.8*   No results for input(s): LIPASE, AMYLASE in the last 168 hours. No results for input(s): AMMONIA in the last 168 hours.  CBC: Recent Labs  Lab 06/03/17 0532 06/03/17 0757 06/04/17 1019 06/05/17 0628 06/08/17 0809  WBC 7.4 6.8 9.9 5.0 9.3  NEUTROABS 5.7  --   --   --   --   HGB 7.3* 7.5* 7.5* 7.5* 7.0*  HCT 22.6* 23.6* 23.0* 23.8* 22.5*  MCV 89.3 89.7 88.5 89.8 91.1  PLT 183 190 220 229 301    Cardiac Enzymes: Recent Labs  Lab 06/03/17 1443  CKTOTAL 17*  CKMB 0.7  TROPONINI <0.03    BNP (last 3 results) No  results for input(s): BNP in the last 8760 hours.  ProBNP (last 3 results) No results for input(s): PROBNP in the last 8760 hours.  Radiological Exams on Admission: Dg Chest Port 1 View  Result Date: 06/08/2017 CLINICAL DATA:  Acute respiratory failure. EXAM: PORTABLE CHEST 1 VIEW COMPARISON:  Radiograph of June 03, 2017. FINDINGS: The heart size and mediastinal contours are within normal limits. Tracheostomy tube is in good position. Right internal jugular dialysis catheter is unchanged. No pneumothorax or pleural effusion is noted. Mild bibasilar interstitial densities are noted concerning for edema or possibly subsegmental atelectasis. The visualized skeletal structures are unremarkable. IMPRESSION: Mild bibasilar edema or subsegmental atelectasis. Electronically Signed   By: Lupita Raider, M.D.   On: 06/08/2017 09:40    Assessment/Plan Active Problems:   Acute on chronic respiratory failure with hypoxia (HCC)   Aspiration pneumonia of both lower lobes due to gastric secretions (HCC)   CKD (chronic kidney disease), stage III (HCC)   COPD with chronic bronchitis (HCC)   1. Acute on chronic respiratory failure with hypoxia we will continue with full vent support patient was attempted on weaning this morning but failed we will continue to reassess 2. Pneumonia due to aspiration patient  has been treated with antibiotics we will continue to follow 3. Chronic kidney disease stage III patient is on dialysis nephrology following 4. COPD severe disease we will continue to monitor   I have personally seen and evaluated the patient, evaluated laboratory and imaging results, formulated the assessment and plan and placed orders. The Patient requires high complexity decision making for assessment and support.  Case was discussed on Rounds with the Respiratory Therapy Staff  Yevonne Pax, MD [facility] Pulmonary Critical Care Medicine Sleep Medicine

## 2017-06-10 DIAGNOSIS — J690 Pneumonitis due to inhalation of food and vomit: Secondary | ICD-10-CM | POA: Diagnosis not present

## 2017-06-10 DIAGNOSIS — J9621 Acute and chronic respiratory failure with hypoxia: Secondary | ICD-10-CM | POA: Diagnosis not present

## 2017-06-10 DIAGNOSIS — N183 Chronic kidney disease, stage 3 (moderate): Secondary | ICD-10-CM | POA: Diagnosis not present

## 2017-06-10 DIAGNOSIS — J449 Chronic obstructive pulmonary disease, unspecified: Secondary | ICD-10-CM | POA: Diagnosis not present

## 2017-06-10 LAB — RENAL FUNCTION PANEL
ANION GAP: 8 (ref 5–15)
Albumin: 1.7 g/dL — ABNORMAL LOW (ref 3.5–5.0)
BUN: 42 mg/dL — ABNORMAL HIGH (ref 6–20)
CO2: 29 mmol/L (ref 22–32)
Calcium: 7.9 mg/dL — ABNORMAL LOW (ref 8.9–10.3)
Chloride: 99 mmol/L — ABNORMAL LOW (ref 101–111)
Creatinine, Ser: 2.4 mg/dL — ABNORMAL HIGH (ref 0.61–1.24)
GFR calc Af Amer: 28 mL/min — ABNORMAL LOW (ref >60)
GFR calc non Af Amer: 24 mL/min — ABNORMAL LOW (ref >60)
GLUCOSE: 97 mg/dL (ref 65–99)
POTASSIUM: 3.1 mmol/L — AB (ref 3.5–5.1)
Phosphorus: 2.3 mg/dL — ABNORMAL LOW (ref 2.5–4.6)
SODIUM: 136 mmol/L (ref 135–145)

## 2017-06-10 LAB — CBC
HEMATOCRIT: 21 % — AB (ref 39.0–52.0)
HEMOGLOBIN: 6.5 g/dL — AB (ref 13.0–17.0)
MCH: 27.5 pg (ref 26.0–34.0)
MCHC: 31 g/dL (ref 30.0–36.0)
MCV: 89 fL (ref 78.0–100.0)
Platelets: 217 10*3/uL (ref 150–400)
RBC: 2.36 MIL/uL — ABNORMAL LOW (ref 4.22–5.81)
RDW: 15.2 % (ref 11.5–15.5)
WBC: 4.6 10*3/uL (ref 4.0–10.5)

## 2017-06-10 LAB — PREPARE RBC (CROSSMATCH)

## 2017-06-10 LAB — ABO/RH: ABO/RH(D): O NEG

## 2017-06-10 NOTE — Progress Notes (Signed)
Pulmonary Critical Care Medicine [facility] [place]   PULMONARY SERVICE  PROGRESS NOTE  Date of Service: 06/10/2017  Mark Farrell  AVW:[id number]  DOB: 06/29/1934   DOA: 06/02/2017  Referring Physician: Carron Curie, MD  HPI: Mark Farrell is a 83 y.o. male seen for follow up of Acute on Chronic Respiratory Failure.  Patient is currently on assist control mode was attempted weaning and failed several times.  Right now is on 20% oxygen.  Medications: Reviewed on Rounds  Physical Exam:  Vitals: Temperature 98.4 pulse 80 respiratory rate 18 blood pressure 98/75 saturations 94%  Ventilator Settings mode of ventilation assist control FiO2 28% tidal volume 475 PEEP 5  . General: Comfortable at this time . Eyes: Grossly normal lids, irises & conjunctiva . ENT: grossly tongue is normal . Neck: no obvious mass . Cardiovascular: S1-S2 normal no gallop or rub . Respiratory: No rhonchi expansion is equal . Abdomen: Soft nontender . Skin: no rash seen on limited exam . Musculoskeletal: not rigid . Psychiatric:unable to assess . Neurologic: no seizure no involuntary movements         Labs on Admission:  Basic Metabolic Panel: Recent Labs  Lab 06/04/17 1019 06/05/17 0628 06/06/17 0751 06/07/17 1027 06/08/17 0809 06/10/17 1214  NA 134* 135  --   --  134* 136  K 3.5 3.4* 3.4* 4.0 4.7 3.1*  CL 97* 99*  --   --  95* 99*  CO2 26 25  --   --  29 29  GLUCOSE 84 154*  --   --  89 97  BUN 59* 37*  --   --  53* 42*  CREATININE 3.83* 2.55*  --   --  3.15* 2.40*  CALCIUM 8.2* 8.2*  --   --  8.2* 7.9*  PHOS 4.9* 3.6  --   --  2.6 2.3*    Liver Function Tests: Recent Labs  Lab 06/04/17 1019 06/05/17 0628 06/08/17 0809 06/10/17 1214  ALBUMIN 2.1* 2.0* 1.8* 1.7*   No results for input(s): LIPASE, AMYLASE in the last 168 hours. No results for input(s): AMMONIA in the last 168 hours.  CBC: Recent Labs  Lab 06/04/17 1019 06/05/17 0628 06/08/17 0809  06/10/17 1214  WBC 9.9 5.0 9.3 4.6  HGB 7.5* 7.5* 7.0* 6.5*  HCT 23.0* 23.8* 22.5* 21.0*  MCV 88.5 89.8 91.1 89.0  PLT 220 229 301 217    Cardiac Enzymes: No results for input(s): CKTOTAL, CKMB, CKMBINDEX, TROPONINI in the last 168 hours.  BNP (last 3 results) No results for input(s): BNP in the last 8760 hours.  ProBNP (last 3 results) No results for input(s): PROBNP in the last 8760 hours.  Radiological Exams on Admission: Dg Chest Port 1 View  Result Date: 06/08/2017 CLINICAL DATA:  Acute respiratory failure. EXAM: PORTABLE CHEST 1 VIEW COMPARISON:  Radiograph of June 03, 2017. FINDINGS: The heart size and mediastinal contours are within normal limits. Tracheostomy tube is in good position. Right internal jugular dialysis catheter is unchanged. No pneumothorax or pleural effusion is noted. Mild bibasilar interstitial densities are noted concerning for edema or possibly subsegmental atelectasis. The visualized skeletal structures are unremarkable. IMPRESSION: Mild bibasilar edema or subsegmental atelectasis. Electronically Signed   By: Lupita Raider, M.D.   On: 06/08/2017 09:40    Assessment/Plan Active Problems:   Acute on chronic respiratory failure with hypoxia (HCC)   Aspiration pneumonia of both lower lobes due to gastric secretions (HCC)   CKD (chronic kidney disease),  stage III (HCC)   COPD with chronic bronchitis (HCC)   1. Acute on chronic respiratory failure with hypoxia we will continue with full vent support.  Reassess the weaning again tomorrow and try to resume 2. Chronic kidney disease stage III nephrology following dialysis 3. COPD advanced disease 4. Aspiration pneumonia treated with antibiotics   I have personally seen and evaluated the patient, evaluated laboratory and imaging results, formulated the assessment and plan and placed orders. The Patient requires high complexity decision making for assessment and support.  Case was discussed on Rounds with  the Respiratory Therapy Staff  Yevonne Pax, MD [facility] Pulmonary Critical Care Medicine Sleep Medicine

## 2017-06-10 NOTE — Progress Notes (Signed)
[facility]  ROUNDING NOTE   Subjective:  Patient seen and evaluated during hemodialysis. Remains critically ill. Still on the ventilator.    Objective:  Vital signs in last 24 hours:  Temperature 98.4 pulse 80 respirations 18 blood pressure 98/75  Physical Exam: General: Ill appearing  Head: Normocephalic, atraumatic. Moist oral mucosal membranes  Eyes: Anicteric  Neck: tracheostomy  Lungs:  Rhonchi, vent assisted  Heart: S1S2 no rubs  Abdomen:  Soft, nontender, +PEG  Extremities: no peripheral edema.  Neurologic: arousable but not following commands  Skin: No lesions  Access: RIJ permcath    Basic Metabolic Panel: Recent Labs  Lab 06/04/17 1019 06/05/17 0628 06/06/17 0751 06/07/17 1027 06/08/17 0809 06/10/17 1214  NA 134* 135  --   --  134* 136  K 3.5 3.4* 3.4* 4.0 4.7 3.1*  CL 97* 99*  --   --  95* 99*  CO2 26 25  --   --  29 29  GLUCOSE 84 154*  --   --  89 97  BUN 59* 37*  --   --  53* 42*  CREATININE 3.83* 2.55*  --   --  3.15* 2.40*  CALCIUM 8.2* 8.2*  --   --  8.2* 7.9*  PHOS 4.9* 3.6  --   --  2.6 2.3*    Liver Function Tests: Recent Labs  Lab 06/04/17 1019 06/05/17 0628 06/08/17 0809 06/10/17 1214  ALBUMIN 2.1* 2.0* 1.8* 1.7*   No results for input(s): LIPASE, AMYLASE in the last 168 hours. No results for input(s): AMMONIA in the last 168 hours.  CBC: Recent Labs  Lab 06/04/17 1019 06/05/17 0628 06/08/17 0809 06/10/17 1214  WBC 9.9 5.0 9.3 4.6  HGB 7.5* 7.5* 7.0* 6.5*  HCT 23.0* 23.8* 22.5* 21.0*  MCV 88.5 89.8 91.1 89.0  PLT 220 229 301 217    Cardiac Enzymes: No results for input(s): CKTOTAL, CKMB, CKMBINDEX, TROPONINI in the last 168 hours.  BNP: Invalid input(s): POCBNP  CBG: No results for input(s): GLUCAP in the last 168 hours.  Microbiology: Results for orders placed or performed during the hospital encounter of 06/02/17  C difficile quick scan w PCR reflex     Status: Abnormal   Collection Time:  06/03/17  6:25 AM  Result Value Ref Range Status   C Diff antigen POSITIVE (A) NEGATIVE Final   C Diff toxin NEGATIVE NEGATIVE Final   C Diff interpretation Results are indeterminate. See PCR results.  Final    Comment: Performed at [facility], [address], [place], [place] [address]  Culture, Urine     Status: None   Collection Time: 06/03/17  6:25 AM  Result Value Ref Range Status   Specimen Description URINE, RANDOM  Final   Special Requests NONE  Final   Culture   Final    NO GROWTH Performed at [facility], [address] [place], [place] [address]    Report Status 06/04/2017 FINAL  Final  C. Diff by PCR, Reflexed     Status: None   Collection Time: 06/03/17  8:09 AM  Result Value Ref Range Status   Toxigenic C. Difficile by PCR NEGATIVE NEGATIVE Final    Comment: Patient is colonized with non toxigenic C. difficile. May not need treatment unless significant symptoms are present. Performed at [facility], [address], [place], [place] [address]   Culture, respiratory (NON-Expectorated)     Status: None   Collection Time: 06/04/17  10:15 AM  Result Value Ref Range Status   Specimen Description TRACHEAL ASPIRATE  Final   Special Requests NONE  Final   Gram Stain   Final    RARE WBC PRESENT, PREDOMINANTLY MONONUCLEAR RARE SQUAMOUS EPITHELIAL CELLS PRESENT NO ORGANISMS SEEN    Culture   Final    Consistent with normal respiratory flora. Performed at [facility], [address] [place], [place] [address]    Report Status 06/06/2017 FINAL  Final  C difficile quick scan w PCR reflex     Status: Abnormal   Collection Time: 06/09/17  2:26 AM  Result Value Ref Range Status   C Diff antigen POSITIVE (A) NEGATIVE Final   C Diff toxin NEGATIVE NEGATIVE Final   C Diff interpretation Results are indeterminate. See PCR results.  Final    Comment: Performed at [facility], [address] [place], [place] [address]  C. Diff by PCR, Reflexed      Status: None   Collection Time: 06/09/17  2:26 AM  Result Value Ref Range Status   Toxigenic C. Difficile by PCR NEGATIVE NEGATIVE Final    Comment: Patient is colonized with non toxigenic C. difficile. May not need treatment unless significant symptoms are present. Performed at [facility], [address], [place], [place] [address]     Coagulation Studies: No results for input(s): LABPROT, INR in the last 72 hours.  Urinalysis: No results for input(s): COLORURINE, LABSPEC, PHURINE, GLUCOSEU, HGBUR, BILIRUBINUR, KETONESUR, PROTEINUR, UROBILINOGEN, NITRITE, LEUKOCYTESUR in the last 72 hours.  Invalid input(s): APPERANCEUR    Imaging: No results found.   Medications:       Assessment/ Plan:  Pt is a 83 y.o. white male with a recent PMHx of acute renal failure on chronic kidney disease stage III requiring hemodialysis status post PermCath placement on June 02, 2017,  acute hypoxic respiratory failure status post tracheostomy placement May 29, 2017, dysphagia status post PEG, recurrent aspiration pneumonia, diabetes mellitus type 2, COPD, hypertension, who was admitted to [facility] on 06/02/2017 for ongoing management.   1.  Acute renal failure requiring hemodialysis. 2.  Chronic kidney disease stage III baseline creatinine 1.3. 3.  Acute respiratory failure status post tracheostomy placement. 4.  Recurrent aspiration pneumonia. 5.  Anemia of chronic kidney disease.  Plan: Patient seen and evaluated during hemodialysis.  He appears to be tolerating this well.  At the moment remains dialysis dependent.  Hemoglobin low at 6.5 today.  He is to receive blood transfusion later today.  Plan to complete the dialysis treatment today and we will tentatively plan for dialysis again on Friday.   LOS: 0 Mark Farrell 5/1/20194:48 PM

## 2017-06-11 DIAGNOSIS — J9621 Acute and chronic respiratory failure with hypoxia: Secondary | ICD-10-CM | POA: Diagnosis not present

## 2017-06-11 DIAGNOSIS — J690 Pneumonitis due to inhalation of food and vomit: Secondary | ICD-10-CM | POA: Diagnosis not present

## 2017-06-11 DIAGNOSIS — N183 Chronic kidney disease, stage 3 (moderate): Secondary | ICD-10-CM | POA: Diagnosis not present

## 2017-06-11 DIAGNOSIS — J449 Chronic obstructive pulmonary disease, unspecified: Secondary | ICD-10-CM | POA: Diagnosis not present

## 2017-06-11 LAB — TYPE AND SCREEN
ABO/RH(D): O NEG
ANTIBODY SCREEN: NEGATIVE
UNIT DIVISION: 0
Unit division: 0

## 2017-06-11 LAB — BPAM RBC
Blood Product Expiration Date: 201905082359
Blood Product Expiration Date: 201905292359
ISSUE DATE / TIME: 201905012155
ISSUE DATE / TIME: 201905011554
UNIT TYPE AND RH: 9500
Unit Type and Rh: 9500

## 2017-06-11 LAB — CBC
HEMATOCRIT: 32.2 % — AB (ref 39.0–52.0)
Hemoglobin: 10.3 g/dL — ABNORMAL LOW (ref 13.0–17.0)
MCH: 27.6 pg (ref 26.0–34.0)
MCHC: 32 g/dL (ref 30.0–36.0)
MCV: 86.3 fL (ref 78.0–100.0)
Platelets: 211 10*3/uL (ref 150–400)
RBC: 3.73 MIL/uL — AB (ref 4.22–5.81)
RDW: 17.1 % — AB (ref 11.5–15.5)
WBC: 7.9 10*3/uL (ref 4.0–10.5)

## 2017-06-11 LAB — OCCULT BLOOD X 1 CARD TO LAB, STOOL: Fecal Occult Bld: NEGATIVE

## 2017-06-11 NOTE — Progress Notes (Signed)
Pulmonary Critical Care Medicine [facility] [place]   PULMONARY SERVICE  PROGRESS NOTE  Date of Service: 06/11/2017  Mark Farrell  [id number]  DOB: [birth year]   DOA: 06/02/2017  Referring Physician: Carron Curie, MD  HPI: Mark Farrell is a 83 y.o. male seen for follow up of Acute on Chronic Respiratory Failure.  Patient right now is on full support has been on assist control mode currently is on 20% oxygen.  Reportedly was able to do 2 hours of pressure support weaning before having to be placed back on the ventilator.  Right now is on full vent support  Medications: Reviewed on Rounds  Physical Exam:  Vitals: Temperature 97.8 pulse 68 respiratory rate 24 blood pressure 125/59 saturations 100%  Ventilator Settings mode of ventilation assist control FiO2 20% tidal volume 472 PEEP 5  . General: Comfortable at this time . Eyes: Grossly normal lids, irises & conjunctiva . ENT: grossly tongue is normal . Neck: no obvious mass . Cardiovascular: S1-S2 normal no gallop or rub . Respiratory: Scattered rhonchi expansion equal . Abdomen: Obese soft nontender . Skin: no rash seen on limited exam . Musculoskeletal: not rigid . Psychiatric:unable to assess . Neurologic: no seizure no involuntary movements         Labs on Admission:  Basic Metabolic Panel: Recent Labs  Lab 06/05/17 0628 06/06/17 0751 06/07/17 1027 06/08/17 0809 06/10/17 1214  NA 135  --   --  134* 136  K 3.4* 3.4* 4.0 4.7 3.1*  CL 99*  --   --  95* 99*  CO2 25  --   --  29 29  GLUCOSE 154*  --   --  89 97  BUN 37*  --   --  53* 42*  CREATININE 2.55*  --   --  3.15* 2.40*  CALCIUM 8.2*  --   --  8.2* 7.9*  PHOS 3.6  --   --  2.6 2.3*    Liver Function Tests: Recent Labs  Lab 06/05/17 0628 06/08/17 0809 06/10/17 1214  ALBUMIN 2.0* 1.8* 1.7*   No results for input(s): LIPASE, AMYLASE in the last 168 hours. No results for input(s): AMMONIA in the last 168 hours.  CBC: Recent  Labs  Lab 06/05/17 0628 06/08/17 0809 06/10/17 1214 06/11/17 0721  WBC 5.0 9.3 4.6 7.9  HGB 7.5* 7.0* 6.5* 10.3*  HCT 23.8* 22.5* 21.0* 32.2*  MCV 89.8 91.1 89.0 86.3  PLT 229 301 217 211    Cardiac Enzymes: No results for input(s): CKTOTAL, CKMB, CKMBINDEX, TROPONINI in the last 168 hours.  BNP (last 3 results) No results for input(s): BNP in the last 8760 hours.  ProBNP (last 3 results) No results for input(s): PROBNP in the last 8760 hours.  Radiological Exams on Admission: Dg Chest Port 1 View  Result Date: 06/08/2017 CLINICAL DATA:  Acute respiratory failure. EXAM: PORTABLE CHEST 1 VIEW COMPARISON:  Radiograph of June 03, 2017. FINDINGS: The heart size and mediastinal contours are within normal limits. Tracheostomy tube is in good position. Right internal jugular dialysis catheter is unchanged. No pneumothorax or pleural effusion is noted. Mild bibasilar interstitial densities are noted concerning for edema or possibly subsegmental atelectasis. The visualized skeletal structures are unremarkable. IMPRESSION: Mild bibasilar edema or subsegmental atelectasis. Electronically Signed   By: Lupita Raider, M.D.   On: 06/08/2017 09:40    Assessment/Plan Active Problems:   Acute on chronic respiratory failure with hypoxia (HCC)   Aspiration pneumonia of both lower  lobes due to gastric secretions (HCC)   CKD (chronic kidney disease), stage III (HCC)   COPD with chronic bronchitis (HCC)   1. Acute on chronic respiratory failure with hypoxia continue to try to wean on pressure support as tolerated.  Patient has been doing fine until about 2 hours as noted above. 2. Pneumonia due to aspiration patient treated with antibiotics we will continue with supportive care 3. Chronic kidney disease stage III we will continue present management nephrology is following along for dialysis 4. COPD with chronic bronchitis at baseline   I have personally seen and evaluated the patient, evaluated  laboratory and imaging results, formulated the assessment and plan and placed orders. The Patient requires high complexity decision making for assessment and support.  Case was discussed on Rounds with the Respiratory Therapy Staff  Yevonne Pax, MD [facility] Pulmonary Critical Care Medicine Sleep Medicine

## 2017-06-12 DIAGNOSIS — N183 Chronic kidney disease, stage 3 (moderate): Secondary | ICD-10-CM | POA: Diagnosis not present

## 2017-06-12 DIAGNOSIS — J9621 Acute and chronic respiratory failure with hypoxia: Secondary | ICD-10-CM | POA: Diagnosis not present

## 2017-06-12 DIAGNOSIS — J449 Chronic obstructive pulmonary disease, unspecified: Secondary | ICD-10-CM | POA: Diagnosis not present

## 2017-06-12 DIAGNOSIS — J690 Pneumonitis due to inhalation of food and vomit: Secondary | ICD-10-CM | POA: Diagnosis not present

## 2017-06-12 LAB — RENAL FUNCTION PANEL
ANION GAP: 12 (ref 5–15)
Albumin: 1.8 g/dL — ABNORMAL LOW (ref 3.5–5.0)
BUN: 45 mg/dL — AB (ref 6–20)
CALCIUM: 8.1 mg/dL — AB (ref 8.9–10.3)
CO2: 27 mmol/L (ref 22–32)
Chloride: 99 mmol/L — ABNORMAL LOW (ref 101–111)
Creatinine, Ser: 2.47 mg/dL — ABNORMAL HIGH (ref 0.61–1.24)
GFR calc Af Amer: 27 mL/min — ABNORMAL LOW (ref >60)
GFR calc non Af Amer: 23 mL/min — ABNORMAL LOW (ref >60)
GLUCOSE: 73 mg/dL (ref 65–99)
Phosphorus: 3.1 mg/dL (ref 2.5–4.6)
Potassium: 2.7 mmol/L — CL (ref 3.5–5.1)
SODIUM: 138 mmol/L (ref 135–145)

## 2017-06-12 LAB — CBC
HCT: 35 % — ABNORMAL LOW (ref 39.0–52.0)
HEMOGLOBIN: 11.3 g/dL — AB (ref 13.0–17.0)
MCH: 28.2 pg (ref 26.0–34.0)
MCHC: 32.3 g/dL (ref 30.0–36.0)
MCV: 87.3 fL (ref 78.0–100.0)
Platelets: 234 10*3/uL (ref 150–400)
RBC: 4.01 MIL/uL — AB (ref 4.22–5.81)
RDW: 17.1 % — ABNORMAL HIGH (ref 11.5–15.5)
WBC: 9.3 10*3/uL (ref 4.0–10.5)

## 2017-06-12 LAB — POTASSIUM: Potassium: 3.2 mmol/L — ABNORMAL LOW (ref 3.5–5.1)

## 2017-06-12 NOTE — Progress Notes (Signed)
Pulmonary Critical Care Medicine [facility] [place]   PULMONARY SERVICE  PROGRESS NOTE  Date of Service: 06/12/2017  Mark Farrell  [id number]  DOB: 03/07/1934   DOA: 06/02/2017  Referring Physician: Carron Curie, MD  HPI: Mark Farrell is a 83 y.o. male seen for follow up of Acute on Chronic Respiratory Failure.  Patient is on weaning right now has been on pressure support the goal is for 8 hours we will continue to try to advance as tolerated.  Medications: Reviewed on Rounds  Physical Exam:  Vitals: Temperature 96.2 pulse 61 respiratory rate 24 blood pressure 124/74 saturations 100%  Ventilator Settings mode of ventilation pressure support tidal volume 450 pressure support 12 PEEP 5 currently is on 28% FiO2  . General: Comfortable at this time . Eyes: Grossly normal lids, irises & conjunctiva . ENT: grossly tongue is normal . Neck: no obvious mass . Cardiovascular: S1-S2 normal no gallop or rub . Respiratory: No rhonchi expansion is equal . Abdomen: Obese and soft . Skin: no rash seen on limited exam . Musculoskeletal: not rigid . Psychiatric:unable to assess . Neurologic: no seizure no involuntary movements         Labs on Admission:  Basic Metabolic Panel: Recent Labs  Lab 06/06/17 0751 06/07/17 1027 06/08/17 0809 06/10/17 1214 06/12/17 0653  NA  --   --  134* 136 138  K 3.4* 4.0 4.7 3.1* 2.7*  CL  --   --  95* 99* 99*  CO2  --   --  GLUCOSE  --   --  89 97 73  BUN  --   --  53* 42* 45*  CREATININE  --   --  3.15* 2.40* 2.47*  CALCIUM  --   --  8.2* 7.9* 8.1*  PHOS  --   --  2.6 2.3* 3.1    Liver Function Tests: Recent Labs  Lab 06/08/17 0809 06/10/17 1214 06/12/17 0653  ALBUMIN 1.8* 1.7* 1.8*   No results for input(s): LIPASE, AMYLASE in the last 168 hours. No results for input(s): AMMONIA in the last 168 hours.  CBC: Recent Labs  Lab 06/08/17 0809 06/10/17 1214 06/11/17 0721 06/12/17 0653  WBC 9.3 4.6 7.9  9.3  HGB 7.0* 6.5* 10.3* 11.3*  HCT 22.5* 21.0* 32.2* 35.0*  MCV 91.1 89.0 86.3 87.3  PLT 301 217 211 234    Cardiac Enzymes: No results for input(s): CKTOTAL, CKMB, CKMBINDEX, TROPONINI in the last 168 hours.  BNP (last 3 results) No results for input(s): BNP in the last 8760 hours.  ProBNP (last 3 results) No results for input(s): PROBNP in the last 8760 hours.  Radiological Exams on Admission: No results found.  Assessment/Plan Active Problems:   Acute on chronic respiratory failure with hypoxia (HCC)   Aspiration pneumonia of both lower lobes due to gastric secretions (HCC)   CKD (chronic kidney disease), stage III (HCC)   COPD with chronic bronchitis (HCC)   1. Acute on chronic respiratory failure with hypoxia we will continue weaning on pressure support as mentioned the goal is for 8 hours today should be able to tolerate without any difficulty. 2. Chronic kidney disease stage III followed by nephrology will continue with dialysis 3. COPD severe disease no distress. 4. Aspiration pneumonia treated with antibiotics we will continue to follow along   I have personally seen and evaluated the patient, evaluated laboratory and imaging results, formulated the assessment and plan and placed orders. The Patient  requires high complexity decision making for assessment and support.  Case was discussed on Rounds with the Respiratory Therapy Staff  Allyne Gee, MD [facility] Pulmonary Critical Care Medicine Sleep Medicine

## 2017-06-12 NOTE — Progress Notes (Signed)
[facility]  ROUNDING NOTE   Subjective:  Patient seen at bedside. He is awake and alert at the moment. He does follow simple commands. Patient due for hemodialysis later today.    Objective:  Vital signs in last 24 hours:  Temperature 96.2 pulse 61 respirations 24 blood pressure 124/74  Physical Exam: General: Ill appearing  Head: Normocephalic, atraumatic. Moist oral mucosal membranes  Eyes: Anicteric  Neck: tracheostomy  Lungs:  Rhonchi bilateral, vent assisted  Heart: S1S2 no rubs  Abdomen:  Soft, nontender, +PEG  Extremities: no peripheral edema.  Neurologic: Awake, alert, will follow simple commands  Skin: No lesions  Access: RIJ permcath    Basic Metabolic Panel: Recent Labs  Lab 06/06/17 0751 06/07/17 1027 06/08/17 0809 06/10/17 1214  NA  --   --  134* 136  K 3.4* 4.0 4.7 3.1*  CL  --   --  95* 99*  CO2  --   --  29 29  GLUCOSE  --   --  89 97  BUN  --   --  53* 42*  CREATININE  --   --  3.15* 2.40*  CALCIUM  --   --  8.2* 7.9*  PHOS  --   --  2.6 2.3*    Liver Function Tests: Recent Labs  Lab 06/08/17 0809 06/10/17 1214  ALBUMIN 1.8* 1.7*   No results for input(s): LIPASE, AMYLASE in the last 168 hours. No results for input(s): AMMONIA in the last 168 hours.  CBC: Recent Labs  Lab 06/08/17 0809 06/10/17 1214 06/11/17 0721 06/12/17 0653  WBC 9.3 4.6 7.9 9.3  HGB 7.0* 6.5* 10.3* 11.3*  HCT 22.5* 21.0* 32.2* 35.0*  MCV 91.1 89.0 86.3 87.3  PLT 301 217 211 234    Cardiac Enzymes: No results for input(s): CKTOTAL, CKMB, CKMBINDEX, TROPONINI in the last 168 hours.  BNP: Invalid input(s): POCBNP  CBG: No results for input(s): GLUCAP in the last 168 hours.  Microbiology: Results for orders placed or performed during the hospital encounter of 06/02/17  C difficile quick scan w PCR reflex     Status: Abnormal   Collection Time: 06/03/17  6:25 AM  Result Value Ref Range Status   C Diff antigen POSITIVE (A) NEGATIVE Final    C Diff toxin NEGATIVE NEGATIVE Final   C Diff interpretation Results are indeterminate. See PCR results.  Final    Comment: Performed at [facility], [address] [place], [place] [address]  Culture, Urine     Status: None   Collection Time: 06/03/17  6:25 AM  Result Value Ref Range Status   Specimen Description URINE, RANDOM  Final   Special Requests NONE  Final   Culture   Final    NO GROWTH Performed at [facility], [address], [place], [place] [address]    Report Status 06/04/2017 FINAL  Final  C. Diff by PCR, Reflexed     Status: None   Collection Time: 06/03/17  8:09 AM  Result Value Ref Range Status   Toxigenic C. Difficile by PCR NEGATIVE NEGATIVE Final    Comment: Patient is colonized with non toxigenic C. difficile. May not need treatment unless significant symptoms are present. Performed at [facility], [address] [place], [place] [address]   Culture, respiratory (NON-Expectorated)     Status: None   Collection Time: 06/04/17 10:15 AM  Result Value Ref Range Status   Specimen Description TRACHEAL ASPIRATE  Final  Special Requests NONE  Final   Gram Stain   Final    RARE WBC PRESENT, PREDOMINANTLY MONONUCLEAR RARE SQUAMOUS EPITHELIAL CELLS PRESENT NO ORGANISMS SEEN    Culture   Final    Consistent with normal respiratory flora. Performed at [facility], [address], [place], [place] [address]    Report Status 06/06/2017 FINAL  Final  C difficile quick scan w PCR reflex     Status: Abnormal   Collection Time: 06/09/17  2:26 AM  Result Value Ref Range Status   C Diff antigen POSITIVE (A) NEGATIVE Final   C Diff toxin NEGATIVE NEGATIVE Final   C Diff interpretation Results are indeterminate. See PCR results.  Final    Comment: Performed at [facility], [address] [place], [place] [address]  C. Diff by PCR, Reflexed     Status: None   Collection Time: 06/09/17  2:26 AM  Result Value Ref Range Status    Toxigenic C. Difficile by PCR NEGATIVE NEGATIVE Final    Comment: Patient is colonized with non toxigenic C. difficile. May not need treatment unless significant symptoms are present. Performed at [facility], [address] [place], [place] [address]     Coagulation Studies: No results for input(s): LABPROT, INR in the last 72 hours.  Urinalysis: No results for input(s): COLORURINE, LABSPEC, PHURINE, GLUCOSEU, HGBUR, BILIRUBINUR, KETONESUR, PROTEINUR, UROBILINOGEN, NITRITE, LEUKOCYTESUR in the last 72 hours.  Invalid input(s): APPERANCEUR    Imaging: No results found.   Medications:       Assessment/ Plan:  Pt is a 83 y.o. white male with a recent PMHx of acute renal failure on chronic kidney disease stage III requiring hemodialysis status post PermCath placement on June 02, 2017,  acute hypoxic respiratory failure status post tracheostomy placement May 29, 2017, dysphagia status post PEG, recurrent aspiration pneumonia, diabetes mellitus type 2, COPD, hypertension, who was admitted to [facility] on 06/02/2017 for ongoing management.   1.  Acute renal failure requiring hemodialysis. 2.  Chronic kidney disease stage III baseline creatinine 1.3. 3.  Acute respiratory failure status post tracheostomy placement. 4.  Recurrent aspiration pneumonia. 5.  Anemia of chronic kidney disease.  Plan: Patient due for hemodialysis later today.  Orders have been prepared.  Hemoglobin significantly improved post dialysis and is currently 11.3.  Case discussed with nursing.  We have suggested to replace Foley catheter to help [place] determine how much urine output he is having.  Tentatively we will plan for dialysis again on Monday however if BUN and creatinine continue to trend down we may be able to stop this sometime next week.   LOS: 0 Troye Hiemstra 5/3/20198:40 AM

## 2017-06-13 LAB — POTASSIUM: POTASSIUM: 3.3 mmol/L — AB (ref 3.5–5.1)

## 2017-06-14 DIAGNOSIS — N183 Chronic kidney disease, stage 3 (moderate): Secondary | ICD-10-CM | POA: Diagnosis not present

## 2017-06-14 DIAGNOSIS — J690 Pneumonitis due to inhalation of food and vomit: Secondary | ICD-10-CM | POA: Diagnosis not present

## 2017-06-14 DIAGNOSIS — J449 Chronic obstructive pulmonary disease, unspecified: Secondary | ICD-10-CM | POA: Diagnosis not present

## 2017-06-14 DIAGNOSIS — J9621 Acute and chronic respiratory failure with hypoxia: Secondary | ICD-10-CM | POA: Diagnosis not present

## 2017-06-14 LAB — RENAL FUNCTION PANEL
Albumin: 1.8 g/dL — ABNORMAL LOW (ref 3.5–5.0)
Anion gap: 10 (ref 5–15)
BUN: 38 mg/dL — ABNORMAL HIGH (ref 6–20)
CHLORIDE: 98 mmol/L — AB (ref 101–111)
CO2: 28 mmol/L (ref 22–32)
Calcium: 8 mg/dL — ABNORMAL LOW (ref 8.9–10.3)
Creatinine, Ser: 2.14 mg/dL — ABNORMAL HIGH (ref 0.61–1.24)
GFR calc Af Amer: 32 mL/min — ABNORMAL LOW (ref >60)
GFR, EST NON AFRICAN AMERICAN: 27 mL/min — AB (ref >60)
GLUCOSE: 82 mg/dL (ref 65–99)
POTASSIUM: 3.7 mmol/L (ref 3.5–5.1)
Phosphorus: 2.9 mg/dL (ref 2.5–4.6)
Sodium: 136 mmol/L (ref 135–145)

## 2017-06-14 LAB — MAGNESIUM: MAGNESIUM: 1.9 mg/dL (ref 1.7–2.4)

## 2017-06-14 NOTE — Progress Notes (Signed)
Pulmonary Critical Care Medicine [facility] [place]   PULMONARY SERVICE  PROGRESS NOTE  Date of Service: 06/14/2017  RAEQWON LUX  ZOX:[id number]  DOB: August 25, 1934   DOA: 06/02/2017  Referring Physician: Carron Curie, MD  HPI: OTHAR CURTO is a 83 y.o. male seen for follow up of Acute on Chronic Respiratory Failure.  Currently is on T collar no distress.  Patient's been on 20% oxygen.  Good saturations are noted right now.  Patient's been on 12-hour goal  Medications: Reviewed on Rounds  Physical Exam:  Vitals: Temperature 97.4 pulse 54 respiratory rate 18 blood pressure 124/59 saturations 100%  Ventilator Settings off of the ventilator on T collar  . General: Comfortable at this time . Eyes: Grossly normal lids, irises & conjunctiva . ENT: grossly tongue is normal . Neck: no obvious mass . Cardiovascular: S1-S2 normal no gallop or rub . Respiratory: No rhonchi noted . Abdomen: Soft nontender . Skin: no rash seen on limited exam . Musculoskeletal: not rigid . Psychiatric:unable to assess . Neurologic: no seizure no involuntary movements         Labs on Admission:  Basic Metabolic Panel: Recent Labs  Lab 06/08/17 0809 06/10/17 1214 06/12/17 0653 06/12/17 2112 06/13/17 0714 06/14/17 0624  NA 134* 136 138  --   --  136  K 4.7 3.1* 2.7* 3.2* 3.3* 3.7  CL 95* 99* 99*  --   --  98*  CO2 --   --  28  GLUCOSE 89 97 73  --   --  82  BUN 53* 42* 45*  --   --  38*  CREATININE 3.15* 2.40* 2.47*  --   --  2.14*  CALCIUM 8.2* 7.9* 8.1*  --   --  8.0*  MG  --   --   --   --   --  1.9  PHOS 2.6 2.3* 3.1  --   --  2.9    Liver Function Tests: Recent Labs  Lab 06/08/17 0809 06/10/17 1214 06/12/17 0653 06/14/17 0624  ALBUMIN 1.8* 1.7* 1.8* 1.8*   No results for input(s): LIPASE, AMYLASE in the last 168 hours. No results for input(s): AMMONIA in the last 168 hours.  CBC: Recent Labs  Lab 06/08/17 0809 06/10/17 1214 06/11/17 0721  06/12/17 0653  WBC 9.3 4.6 7.9 9.3  HGB 7.0* 6.5* 10.3* 11.3*  HCT 22.5* 21.0* 32.2* 35.0*  MCV 91.1 89.0 86.3 87.3  PLT 301 217 211 234    Cardiac Enzymes: No results for input(s): CKTOTAL, CKMB, CKMBINDEX, TROPONINI in the last 168 hours.  BNP (last 3 results) No results for input(s): BNP in the last 8760 hours.  ProBNP (last 3 results) No results for input(s): PROBNP in the last 8760 hours.  Radiological Exams on Admission: No results found.  Assessment/Plan Active Problems:   Acute on chronic respiratory failure with hypoxia (HCC)   Aspiration pneumonia of both lower lobes due to gastric secretions (HCC)   CKD (chronic kidney disease), stage III (HCC)   COPD with chronic bronchitis (HCC)   1. Acute on chronic respiratory failure with hypoxia we will continue with weaning goal of 12 hours as noted above. 2. Aspiration pneumonia treated with antibiotics we will continue supportive care 3. Chronic kidney disease stage III followed by nephrology will follow the recommendations 4. Severe COPD with chronic bronchitis at baseline   I have personally seen and evaluated the patient, evaluated laboratory and imaging results, formulated the assessment  and plan and placed orders. The Patient requires high complexity decision making for assessment and support.  Case was discussed on Rounds with the Respiratory Therapy Staff  Allyne Gee, MD [facility] Pulmonary Critical Care Medicine Sleep Medicine

## 2017-06-15 DIAGNOSIS — J449 Chronic obstructive pulmonary disease, unspecified: Secondary | ICD-10-CM | POA: Diagnosis not present

## 2017-06-15 DIAGNOSIS — J9621 Acute and chronic respiratory failure with hypoxia: Secondary | ICD-10-CM | POA: Diagnosis not present

## 2017-06-15 DIAGNOSIS — J690 Pneumonitis due to inhalation of food and vomit: Secondary | ICD-10-CM | POA: Diagnosis not present

## 2017-06-15 DIAGNOSIS — N183 Chronic kidney disease, stage 3 (moderate): Secondary | ICD-10-CM | POA: Diagnosis not present

## 2017-06-15 LAB — RENAL FUNCTION PANEL
ALBUMIN: 1.8 g/dL — AB (ref 3.5–5.0)
Anion gap: 10 (ref 5–15)
BUN: 49 mg/dL — ABNORMAL HIGH (ref 6–20)
CALCIUM: 8.2 mg/dL — AB (ref 8.9–10.3)
CHLORIDE: 101 mmol/L (ref 101–111)
CO2: 27 mmol/L (ref 22–32)
CREATININE: 2.44 mg/dL — AB (ref 0.61–1.24)
GFR, EST AFRICAN AMERICAN: 27 mL/min — AB (ref >60)
GFR, EST NON AFRICAN AMERICAN: 23 mL/min — AB (ref >60)
Glucose, Bld: 81 mg/dL (ref 65–99)
PHOSPHORUS: 3.5 mg/dL (ref 2.5–4.6)
Potassium: 3.4 mmol/L — ABNORMAL LOW (ref 3.5–5.1)
SODIUM: 138 mmol/L (ref 135–145)

## 2017-06-15 LAB — CBC
HCT: 37.3 % — ABNORMAL LOW (ref 39.0–52.0)
Hemoglobin: 11.9 g/dL — ABNORMAL LOW (ref 13.0–17.0)
MCH: 28.3 pg (ref 26.0–34.0)
MCHC: 31.9 g/dL (ref 30.0–36.0)
MCV: 88.6 fL (ref 78.0–100.0)
Platelets: 254 10*3/uL (ref 150–400)
RBC: 4.21 MIL/uL — AB (ref 4.22–5.81)
RDW: 17 % — ABNORMAL HIGH (ref 11.5–15.5)
WBC: 9 10*3/uL (ref 4.0–10.5)

## 2017-06-15 NOTE — Progress Notes (Signed)
[facility]  ROUNDING NOTE   Subjective:  Patient seen at bedside. Appears to be tolerating dialysis well. Patient has good urine output today. There is at least 500 cc of urine output in the Foley bag at the moment.    Objective:  Vital signs in last 24 hours:  Temperature 97.5 pulse 76 respiration 38 blood pressure 111/58  Physical Exam: General: Ill appearing  Head: Normocephalic, atraumatic. Moist oral mucosal membranes  Eyes: Anicteric  Neck: tracheostomy  Lungs:  Rhonchi bilateral  Heart: S1S2 no rubs  Abdomen:  Soft, nontender, +PEG  Extremities: no peripheral edema.  Neurologic: Awake, alert, will follow simple commands  Skin: No lesions  Access: RIJ permcath    Basic Metabolic Panel: Recent Labs  Lab 06/10/17 1214 06/12/17 0653 06/12/17 2112 06/13/17 0714 06/14/17 0624 06/15/17 0532  NA 136 138  --   --  136 138  K 3.1* 2.7* 3.2* 3.3* 3.7 3.4*  CL 99* 99*  --   --  98* 101  CO2 29 27  --   --  28 27  GLUCOSE 97 73  --   --  82 81  BUN 42* 45*  --   --  38* 49*  CREATININE 2.40* 2.47*  --   --  2.14* 2.44*  CALCIUM 7.9* 8.1*  --   --  8.0* 8.2*  MG  --   --   --   --  1.9  --   PHOS 2.3* 3.1  --   --  2.9 3.5    Liver Function Tests: Recent Labs  Lab 06/10/17 1214 06/12/17 0653 06/14/17 0624 06/15/17 0532  ALBUMIN 1.7* 1.8* 1.8* 1.8*   No results for input(s): LIPASE, AMYLASE in the last 168 hours. No results for input(s): AMMONIA in the last 168 hours.  CBC: Recent Labs  Lab 06/10/17 1214 06/11/17 0721 06/12/17 0653 06/15/17 0532  WBC 4.6 7.9 9.3 9.0  HGB 6.5* 10.3* 11.3* 11.9*  HCT 21.0* 32.2* 35.0* 37.3*  MCV 89.0 86.3 87.3 88.6  PLT 217 211 234 254    Cardiac Enzymes: No results for input(s): CKTOTAL, CKMB, CKMBINDEX, TROPONINI in the last 168 hours.  BNP: Invalid input(s): POCBNP  CBG: No results for input(s): GLUCAP in the last 168 hours.  Microbiology: Results for orders placed or performed during the  hospital encounter of 06/02/17  C difficile quick scan w PCR reflex     Status: Abnormal   Collection Time: 06/03/17  6:25 AM  Result Value Ref Range Status   C Diff antigen POSITIVE (A) NEGATIVE Final   C Diff toxin NEGATIVE NEGATIVE Final   C Diff interpretation Results are indeterminate. See PCR results.  Final    Comment: Performed at [facility], [address] [place], [place] [address]  Culture, Urine     Status: None   Collection Time: 06/03/17  6:25 AM  Result Value Ref Range Status   Specimen Description URINE, RANDOM  Final   Special Requests NONE  Final   Culture   Final    NO GROWTH Performed at [facility], [address] [place], [place] [address]    Report Status 06/04/2017 FINAL  Final  C. Diff by PCR, Reflexed     Status: None   Collection Time: 06/03/17  8:09 AM  Result Value Ref Range Status   Toxigenic C. Difficile by PCR NEGATIVE NEGATIVE Final    Comment: Patient is colonized with non toxigenic C. difficile. May  not need treatment unless significant symptoms are present. Performed at [facility], [address], [place], [place] [address]   Culture, respiratory (NON-Expectorated)     Status: None   Collection Time: 06/04/17 10:15 AM  Result Value Ref Range Status   Specimen Description TRACHEAL ASPIRATE  Final   Special Requests NONE  Final   Gram Stain   Final    RARE WBC PRESENT, PREDOMINANTLY MONONUCLEAR RARE SQUAMOUS EPITHELIAL CELLS PRESENT NO ORGANISMS SEEN    Culture   Final    Consistent with normal respiratory flora. Performed at [facility], [address] [place], [place] [address]    Report Status 06/06/2017 FINAL  Final  C difficile quick scan w PCR reflex     Status: Abnormal   Collection Time: 06/09/17  2:26 AM  Result Value Ref Range Status   C Diff antigen POSITIVE (A) NEGATIVE Final   C Diff toxin NEGATIVE NEGATIVE Final   C Diff interpretation Results are indeterminate. See PCR results.  Final     Comment: Performed at [facility], [address], [place], [place] [address]  C. Diff by PCR, Reflexed     Status: None   Collection Time: 06/09/17  2:26 AM  Result Value Ref Range Status   Toxigenic C. Difficile by PCR NEGATIVE NEGATIVE Final    Comment: Patient is colonized with non toxigenic C. difficile. May not need treatment unless significant symptoms are present. Performed at [facility], [address] [place], [place] [address]     Coagulation Studies: No results for input(s): LABPROT, INR in the last 72 hours.  Urinalysis: No results for input(s): COLORURINE, LABSPEC, PHURINE, GLUCOSEU, HGBUR, BILIRUBINUR, KETONESUR, PROTEINUR, UROBILINOGEN, NITRITE, LEUKOCYTESUR in the last 72 hours.  Invalid input(s): APPERANCEUR    Imaging: No results found.   Medications:       Assessment/ Plan:  Pt is a 83 y.o. white male with a recent PMHx of acute renal failure on chronic kidney disease stage III requiring hemodialysis status post PermCath placement on June 02, 2017,  acute hypoxic respiratory failure status post tracheostomy placement May 29, 2017, dysphagia status post PEG, recurrent aspiration pneumonia, diabetes mellitus type 2, COPD, hypertension, who was admitted to [facility] on 06/02/2017 for ongoing management.   1.  Acute renal failure requiring hemodialysis. 2.  Chronic kidney disease stage III baseline creatinine 1.3. 3.  Acute respiratory failure status post tracheostomy placement. 4.  Recurrent aspiration pneumonia. 5.  Anemia of chronic kidney disease.  Plan: Renal function not significantly worse over the weekend.  BUN 49 with a creatinine of 2.4.  He was seen and evaluated during hemodialysis today.  Ultrafiltration target was low at 1 kg today.  We will hold further dialysis treatments and follow urine output as well as creatinine trend.  This is a good sign.   LOS: 0 Brentt Fread 5/6/20193:16 PM

## 2017-06-15 NOTE — Progress Notes (Signed)
Pulmonary Critical Care Medicine [facility] [place]   PULMONARY SERVICE  PROGRESS NOTE  Date of Service: 06/15/2017  Mark Farrell  RUE:[id number]  DOB: 04-14-1934   DOA: 06/02/2017  Referring Physician: Carron Curie, MD  HPI: Mark Farrell is a 83 y.o. male seen for follow up of Acute on Chronic Respiratory Failure.  Patient is on T collar he now has been on 20% oxygen.  Off the ventilator for more than 24 hours  Medications: Reviewed on Rounds  Physical Exam:  Vitals: Temperature 97.5 pulse 97 respiratory rate 16 blood pressure 105 68 saturations per percent  Ventilator Settings aerosolized T collar FiO2 20%  . General: Comfortable at this time . Eyes: Grossly normal lids, irises & conjunctiva . ENT: grossly tongue is normal . Neck: no obvious mass . Cardiovascular: S1-S2 normal no gallop or rub . Respiratory: No rhonchi expansion is equal . Abdomen: Soft nontender . Skin: no rash seen on limited exam . Musculoskeletal: not rigid . Psychiatric:unable to assess . Neurologic: no seizure no involuntary movements         Labs on Admission:  Basic Metabolic Panel: Recent Labs  Lab 06/10/17 1214 06/12/17 0653 06/12/17 2112 06/13/17 0714 06/14/17 0624 06/15/17 0532  NA 136 138  --   --  136 138  K 3.1* 2.7* 3.2* 3.3* 3.7 3.4*  CL 99* 99*  --   --  98* 101  CO2 29 27  --   --  28 27  GLUCOSE 97 73  --   --  82 81  BUN 42* 45*  --   --  38* 49*  CREATININE 2.40* 2.47*  --   --  2.14* 2.44*  CALCIUM 7.9* 8.1*  --   --  8.0* 8.2*  MG  --   --   --   --  1.9  --   PHOS 2.3* 3.1  --   --  2.9 3.5    Liver Function Tests: Recent Labs  Lab 06/10/17 1214 06/12/17 0653 06/14/17 0624 06/15/17 0532  ALBUMIN 1.7* 1.8* 1.8* 1.8*   No results for input(s): LIPASE, AMYLASE in the last 168 hours. No results for input(s): AMMONIA in the last 168 hours.  CBC: Recent Labs  Lab 06/10/17 1214 06/11/17 0721 06/12/17 0653 06/15/17 0532  WBC 4.6 7.9  9.3 9.0  HGB 6.5* 10.3* 11.3* 11.9*  HCT 21.0* 32.2* 35.0* 37.3*  MCV 89.0 86.3 87.3 88.6  PLT 217 211 234 254    Cardiac Enzymes: No results for input(s): CKTOTAL, CKMB, CKMBINDEX, TROPONINI in the last 168 hours.  BNP (last 3 results) No results for input(s): BNP in the last 8760 hours.  ProBNP (last 3 results) No results for input(s): PROBNP in the last 8760 hours.  Radiological Exams on Admission: No results found.  Assessment/Plan Active Problems:   Acute on chronic respiratory failure with hypoxia (HCC)   Aspiration pneumonia of both lower lobes due to gastric secretions (HCC)   CKD (chronic kidney disease), stage III (HCC)   COPD with chronic bronchitis (HCC)   1. Acute on chronic respiratory failure with hypoxia we will continue to advance to wean as mentioned is on T collar FiO2 20% 2. Chronic kidney disease stage III nephrology following dialysis as ordered 3. COPD with chronic bronchitis we will continue with supportive care 4. Aspiration pneumonia treated with antibiotics we will continue to follow along   I have personally seen and evaluated the patient, evaluated laboratory and imaging results, formulated  the assessment and plan and placed orders. The Patient requires high complexity decision making for assessment and support.  Case was discussed on Rounds with the Respiratory Therapy Staff  Allyne Gee, MD [facility] Pulmonary Critical Care Medicine Sleep Medicine

## 2017-06-16 DIAGNOSIS — J449 Chronic obstructive pulmonary disease, unspecified: Secondary | ICD-10-CM | POA: Diagnosis not present

## 2017-06-16 DIAGNOSIS — J9621 Acute and chronic respiratory failure with hypoxia: Secondary | ICD-10-CM | POA: Diagnosis not present

## 2017-06-16 DIAGNOSIS — N183 Chronic kidney disease, stage 3 (moderate): Secondary | ICD-10-CM | POA: Diagnosis not present

## 2017-06-16 DIAGNOSIS — J690 Pneumonitis due to inhalation of food and vomit: Secondary | ICD-10-CM | POA: Diagnosis not present

## 2017-06-16 LAB — POTASSIUM: POTASSIUM: 3.6 mmol/L (ref 3.5–5.1)

## 2017-06-16 NOTE — Progress Notes (Signed)
Pulmonary Critical Care Medicine [facility] [place]   PULMONARY SERVICE  PROGRESS NOTE  Date of Service: 06/16/2017  Mark Farrell  ZOX:[id number]  DOB: December 03, 1934   DOA: 06/02/2017  Referring Physician: Carron Curie, MD  HPI: Mark Farrell is a 83 y.o. male seen for follow up of Acute on Chronic Respiratory Failure.  Currently is on T collar patient's been on 20% oxygen.  Good saturations are noted has been on T collar for more than 48 hours  Medications: Reviewed on Rounds  Physical Exam:  Vitals: Temperature 98.4 pulse 78 respiratory rate 23 blood pressure 127 or 65 saturations 99%  Ventilator Settings off of the ventilator  . General: Comfortable at this time . Eyes: Grossly normal lids, irises & conjunctiva . ENT: grossly tongue is normal . Neck: no obvious mass . Cardiovascular: S1-S2 normal no gallop or rub . Respiratory: No rhonchi expansion is equal . Abdomen: Soft nontender . Skin: no rash seen on limited exam . Musculoskeletal: not rigid . Psychiatric:unable to assess . Neurologic: no seizure no involuntary movements         Labs on Admission:  Basic Metabolic Panel: Recent Labs  Lab 06/10/17 1214 06/12/17 0653 06/12/17 2112 06/13/17 0714 06/14/17 0624 06/15/17 0532 06/16/17 0715  NA 136 138  --   --  136 138  --   K 3.1* 2.7* 3.2* 3.3* 3.7 3.4* 3.6  CL 99* 99*  --   --  98* 101  --   CO2 29 27  --   --  28 27  --   GLUCOSE 97 73  --   --  82 81  --   BUN 42* 45*  --   --  38* 49*  --   CREATININE 2.40* 2.47*  --   --  2.14* 2.44*  --   CALCIUM 7.9* 8.1*  --   --  8.0* 8.2*  --   MG  --   --   --   --  1.9  --   --   PHOS 2.3* 3.1  --   --  2.9 3.5  --     Liver Function Tests: Recent Labs  Lab 06/10/17 1214 06/12/17 0653 06/14/17 0624 06/15/17 0532  ALBUMIN 1.7* 1.8* 1.8* 1.8*   No results for input(s): LIPASE, AMYLASE in the last 168 hours. No results for input(s): AMMONIA in the last 168 hours.  CBC: Recent Labs   Lab 06/10/17 1214 06/11/17 0721 06/12/17 0653 06/15/17 0532  WBC 4.6 7.9 9.3 9.0  HGB 6.5* 10.3* 11.3* 11.9*  HCT 21.0* 32.2* 35.0* 37.3*  MCV 89.0 86.3 87.3 88.6  PLT 217 211 234 254    Cardiac Enzymes: No results for input(s): CKTOTAL, CKMB, CKMBINDEX, TROPONINI in the last 168 hours.  BNP (last 3 results) No results for input(s): BNP in the last 8760 hours.  ProBNP (last 3 results) No results for input(s): PROBNP in the last 8760 hours.  Radiological Exams on Admission: No results found.  Assessment/Plan Active Problems:   Acute on chronic respiratory failure with hypoxia (HCC)   Aspiration pneumonia of both lower lobes due to gastric secretions (HCC)   CKD (chronic kidney disease), stage III (HCC)   COPD with chronic bronchitis (HCC)   1. Acute on chronic respiratory failure with hypoxia continue weaning on T collar trials will likely be able to advance to capping soon 2. Chronic kidney disease stage III followed by nephrology dialyze as needed 3. COPD severe disease we  will continue present management 4. pneumonia due to aspiration treated with antibiotics we will continue to monitor   I have personally seen and evaluated the patient, evaluated laboratory and imaging results, formulated the assessment and plan and placed orders. The Patient requires high complexity decision making for assessment and support.  Case was discussed on Rounds with the Respiratory Therapy Staff  Yevonne Pax, MD [facility] Pulmonary Critical Care Medicine Sleep Medicine

## 2017-06-17 DIAGNOSIS — N183 Chronic kidney disease, stage 3 (moderate): Secondary | ICD-10-CM | POA: Diagnosis not present

## 2017-06-17 DIAGNOSIS — J449 Chronic obstructive pulmonary disease, unspecified: Secondary | ICD-10-CM | POA: Diagnosis not present

## 2017-06-17 DIAGNOSIS — J690 Pneumonitis due to inhalation of food and vomit: Secondary | ICD-10-CM | POA: Diagnosis not present

## 2017-06-17 DIAGNOSIS — J9621 Acute and chronic respiratory failure with hypoxia: Secondary | ICD-10-CM | POA: Diagnosis not present

## 2017-06-17 LAB — RENAL FUNCTION PANEL
ANION GAP: 11 (ref 5–15)
Albumin: 2 g/dL — ABNORMAL LOW (ref 3.5–5.0)
BUN: 45 mg/dL — ABNORMAL HIGH (ref 6–20)
CO2: 30 mmol/L (ref 22–32)
Calcium: 8.5 mg/dL — ABNORMAL LOW (ref 8.9–10.3)
Chloride: 98 mmol/L — ABNORMAL LOW (ref 101–111)
Creatinine, Ser: 2.31 mg/dL — ABNORMAL HIGH (ref 0.61–1.24)
GFR calc non Af Amer: 25 mL/min — ABNORMAL LOW (ref >60)
GFR, EST AFRICAN AMERICAN: 29 mL/min — AB (ref >60)
Glucose, Bld: 174 mg/dL — ABNORMAL HIGH (ref 65–99)
Phosphorus: 3.8 mg/dL (ref 2.5–4.6)
Potassium: 4.3 mmol/L (ref 3.5–5.1)
SODIUM: 139 mmol/L (ref 135–145)

## 2017-06-17 NOTE — Progress Notes (Signed)
[facility]  ROUNDING NOTE   Subjective:  No new renal parameters today.  Patient resting comfortably in bed. He is awake and alert.    Objective:  Vital signs in last 24 hours:  Temperature 98 pulse 75 respirations 20 blood pressure 125/60  Physical Exam: General: Ill appearing  Head: Normocephalic, atraumatic. Moist oral mucosal membranes  Eyes: Anicteric  Neck: tracheostomy  Lungs:  Rhonchi bilateral  Heart: S1S2 no rubs  Abdomen:  Soft, nontender, +PEG  Extremities: no peripheral edema.  Neurologic: Awake, alert, will follow simple commands  Skin: No lesions  Access: RIJ permcath    Basic Metabolic Panel: Recent Labs  Lab 06/12/17 0653 06/12/17 2112 06/13/17 0714 06/14/17 0624 06/15/17 0532 06/16/17 0715  NA 138  --   --  136 138  --   K 2.7* 3.2* 3.3* 3.7 3.4* 3.6  CL 99*  --   --  98* 101  --   CO2 27  --   --  28 27  --   GLUCOSE 73  --   --  82 81  --   BUN 45*  --   --  38* 49*  --   CREATININE 2.47*  --   --  2.14* 2.44*  --   CALCIUM 8.1*  --   --  8.0* 8.2*  --   MG  --   --   --  1.9  --   --   PHOS 3.1  --   --  2.9 3.5  --     Liver Function Tests: Recent Labs  Lab 06/12/17 0653 06/14/17 0624 06/15/17 0532  ALBUMIN 1.8* 1.8* 1.8*   No results for input(s): LIPASE, AMYLASE in the last 168 hours. No results for input(s): AMMONIA in the last 168 hours.  CBC: Recent Labs  Lab 06/11/17 0721 06/12/17 0653 06/15/17 0532  WBC 7.9 9.3 9.0  HGB 10.3* 11.3* 11.9*  HCT 32.2* 35.0* 37.3*  MCV 86.3 87.3 88.6  PLT 211 234 254    Cardiac Enzymes: No results for input(s): CKTOTAL, CKMB, CKMBINDEX, TROPONINI in the last 168 hours.  BNP: Invalid input(s): POCBNP  CBG: No results for input(s): GLUCAP in the last 168 hours.  Microbiology: Results for orders placed or performed during the hospital encounter of 06/02/17  C difficile quick scan w PCR reflex     Status: Abnormal   Collection Time: 06/03/17  6:25 AM  Result Value  Ref Range Status   C Diff antigen POSITIVE (A) NEGATIVE Final   C Diff toxin NEGATIVE NEGATIVE Final   C Diff interpretation Results are indeterminate. See PCR results.  Final    Comment: Performed at [facility] Lab, [address], [place], [place] [address]  Culture, Urine     Status: None   Collection Time: 06/03/17  6:25 AM  Result Value Ref Range Status   Specimen Description URINE, RANDOM  Final   Special Requests NONE  Final   Culture   Final    NO GROWTH Performed at [facility], [address], [place], [place] [address]    Report Status 06/04/2017 FINAL  Final  C. Diff by PCR, Reflexed     Status: None   Collection Time: 06/03/17  8:09 AM  Result Value Ref Range Status   Toxigenic C. Difficile by PCR NEGATIVE NEGATIVE Final    Comment: Patient is colonized with non toxigenic C. difficile. May not need treatment unless significant symptoms are present. Performed at  [facility], [address] [place] [place], [place] [address]   Culture, respiratory (NON-Expectorated)     Status: None   Collection Time: 06/04/17 10:15 AM  Result Value Ref Range Status   Specimen Description TRACHEAL ASPIRATE  Final   Special Requests NONE  Final   Gram Stain   Final    RARE WBC PRESENT, PREDOMINANTLY MONONUCLEAR RARE SQUAMOUS EPITHELIAL CELLS PRESENT NO ORGANISMS SEEN    Culture   Final    Consistent with normal respiratory flora. Performed at [facility], [address], [place], [place] [address]    Report Status 06/06/2017 FINAL  Final  C difficile quick scan w PCR reflex     Status: Abnormal   Collection Time: 06/09/17  2:26 AM  Result Value Ref Range Status   C Diff antigen POSITIVE (A) NEGATIVE Final   C Diff toxin NEGATIVE NEGATIVE Final   C Diff interpretation Results are indeterminate. See PCR results.  Final    Comment: Performed at [facility], [address] [place], [place] [address]  C. Diff by PCR, Reflexed     Status: None   Collection  Time: 06/09/17  2:26 AM  Result Value Ref Range Status   Toxigenic C. Difficile by PCR NEGATIVE NEGATIVE Final    Comment: Patient is colonized with non toxigenic C. difficile. May not need treatment unless significant symptoms are present. Performed at [facility], [address] [place], [place] [address]     Coagulation Studies: No results for input(s): LABPROT, INR in the last 72 hours.  Urinalysis: No results for input(s): COLORURINE, LABSPEC, PHURINE, GLUCOSEU, HGBUR, BILIRUBINUR, KETONESUR, PROTEINUR, UROBILINOGEN, NITRITE, LEUKOCYTESUR in the last 72 hours.  Invalid input(s): APPERANCEUR    Imaging: No results found.   Medications:       Assessment/ Plan:  Pt is a 83 y.o. white male with a recent PMHx of acute renal failure on chronic kidney disease stage III requiring hemodialysis status post PermCath placement on June 02, 2017,  acute hypoxic respiratory failure status post tracheostomy placement May 29, 2017, dysphagia status post PEG, recurrent aspiration pneumonia, diabetes mellitus type 2, COPD, hypertension, who was admitted to [facility] on 06/02/2017 for ongoing management.   1.  Acute renal failure previously requiring hemodialysis. 2.  Chronic kidney disease stage III baseline creatinine 1.3. 3.  Acute respiratory failure status post tracheostomy placement. 4.  Recurrent aspiration pneumonia. 5.  Anemia of chronic kidney disease.  Plan: The patient has had some partial improvement in renal function.  No renal function testing available today.  Therefore we will order renal function follow-up.  The patient's anemia appears to be stable with most recent hemoglobin of 11.9.  He is breathing comfortably through his tracheostomy.  Hold off on additional dialysis for now.   LOS: 0 Shaylyn Bawa 5/8/20192:38 PM

## 2017-06-17 NOTE — Progress Notes (Signed)
Pulmonary Critical Care Medicine [facility] [place]   PULMONARY SERVICE  PROGRESS NOTE  Date of Service: 06/17/2017  Mark Farrell  ZOX:[id number]  DOB: Sep 08, 1934   DOA: 06/02/2017  Referring Physician: Carron Curie, MD  HPI: Mark Farrell is a 83 y.o. male seen for follow up of Acute on Chronic Respiratory Failure.  Patient is on T collar has been on 20% FiO2 and O2 saturations are very good secretions are improving but there are thick  Medications: Reviewed on Rounds  Physical Exam:  Vitals: Temperature 98.0 pulse 84 respiratory rate 17 blood pressure 135/90 saturations 94%  Ventilator Settings off of the ventilator on T collar currently is on 28% FiO2  . General: Comfortable at this time . Eyes: Grossly normal lids, irises & conjunctiva . ENT: grossly tongue is normal . Neck: no obvious mass . Cardiovascular: S1-S2 normal no gallop or rub . Respiratory: No rhonchi expansion is equal . Abdomen: Soft nontender . Skin: no rash seen on limited exam . Musculoskeletal: not rigid . Psychiatric:unable to assess . Neurologic: no seizure no involuntary movements         Labs on Admission:  Basic Metabolic Panel: Recent Labs  Lab 06/12/17 0653 06/12/17 2112 06/13/17 0714 06/14/17 0624 06/15/17 0532 06/16/17 0715  NA 138  --   --  136 138  --   K 2.7* 3.2* 3.3* 3.7 3.4* 3.6  CL 99*  --   --  98* 101  --   CO2 27  --   --  28 27  --   GLUCOSE 73  --   --  82 81  --   BUN 45*  --   --  38* 49*  --   CREATININE 2.47*  --   --  2.14* 2.44*  --   CALCIUM 8.1*  --   --  8.0* 8.2*  --   MG  --   --   --  1.9  --   --   PHOS 3.1  --   --  2.9 3.5  --     Liver Function Tests: Recent Labs  Lab 06/12/17 0653 06/14/17 0624 06/15/17 0532  ALBUMIN 1.8* 1.8* 1.8*   No results for input(s): LIPASE, AMYLASE in the last 168 hours. No results for input(s): AMMONIA in the last 168 hours.  CBC: Recent Labs  Lab 06/11/17 0721 06/12/17 0653 06/15/17 0532   WBC 7.9 9.3 9.0  HGB 10.3* 11.3* 11.9*  HCT 32.2* 35.0* 37.3*  MCV 86.3 87.3 88.6  PLT 211 234 254    Cardiac Enzymes: No results for input(s): CKTOTAL, CKMB, CKMBINDEX, TROPONINI in the last 168 hours.  BNP (last 3 results) No results for input(s): BNP in the last 8760 hours.  ProBNP (last 3 results) No results for input(s): PROBNP in the last 8760 hours.  Radiological Exams on Admission: No results found.  Assessment/Plan Active Problems:   Acute on chronic respiratory failure with hypoxia (HCC)   Aspiration pneumonia of both lower lobes due to gastric secretions (HCC)   CKD (chronic kidney disease), stage III (HCC)   COPD with chronic bronchitis (HCC)   1. Acute on chronic respiratory failure with hypoxia patient has been to continue with T collar trials will change the trach to a size 6 cuffless trach 2. Chronic kidney disease stage III patient's being followed by nephrology continue with dialysis 3. Chronic obstructive pulmonary disease we will continue with supportive care at this time. 4. Pneumonia due to aspiration  will continue with supportive care and continue with aspiration precautions   I have personally seen and evaluated the patient, evaluated laboratory and imaging results, formulated the assessment and plan and placed orders. The Patient requires high complexity decision making for assessment and support.  Case was discussed on Rounds with the Respiratory Therapy Staff  Yevonne Pax, MD [facility] Pulmonary Critical Care Medicine Sleep Medicine

## 2017-06-18 DIAGNOSIS — J9621 Acute and chronic respiratory failure with hypoxia: Secondary | ICD-10-CM | POA: Diagnosis not present

## 2017-06-18 DIAGNOSIS — N183 Chronic kidney disease, stage 3 (moderate): Secondary | ICD-10-CM | POA: Diagnosis not present

## 2017-06-18 DIAGNOSIS — J690 Pneumonitis due to inhalation of food and vomit: Secondary | ICD-10-CM | POA: Diagnosis not present

## 2017-06-18 DIAGNOSIS — J449 Chronic obstructive pulmonary disease, unspecified: Secondary | ICD-10-CM | POA: Diagnosis not present

## 2017-06-18 LAB — CBC
HEMATOCRIT: 38.7 % — AB (ref 39.0–52.0)
HEMOGLOBIN: 12.3 g/dL — AB (ref 13.0–17.0)
MCH: 28.1 pg (ref 26.0–34.0)
MCHC: 31.8 g/dL (ref 30.0–36.0)
MCV: 88.4 fL (ref 78.0–100.0)
Platelets: 239 10*3/uL (ref 150–400)
RBC: 4.38 MIL/uL (ref 4.22–5.81)
RDW: 16.6 % — AB (ref 11.5–15.5)
WBC: 10 10*3/uL (ref 4.0–10.5)

## 2017-06-18 NOTE — Progress Notes (Signed)
Pulmonary Critical Care Medicine [facility] [place]   PULMONARY SERVICE  PROGRESS NOTE  Date of Service: 06/18/2017  Mark Farrell  WUJ:[id number]  DOB: 07-04-1934   DOA: 06/02/2017  Referring Physician: Carron Curie, MD  HPI: Mark Farrell is a 83 y.o. male seen for follow up of Acute on Chronic Respiratory Failure.  Patient is weaning on T collar has been tolerating PMV also.  Mark Farrell is comfortable without distress  Medications: Reviewed on Rounds  Physical Exam:  Vitals: Temperature 97.8 pulse 72 respiratory rate 17 blood pressure 145/78 saturations 96%  Ventilator Settings off of the ventilator at this time  . General: Comfortable at this time . Eyes: Grossly normal lids, irises & conjunctiva . ENT: grossly tongue is normal . Neck: no obvious mass . Cardiovascular: S1-S2 normal no gallop rub . Respiratory: No rhonchi expansion is equal bilaterally . Abdomen: Soft nontender . Skin: no rash seen on limited exam . Musculoskeletal: not rigid . Psychiatric:unable to assess . Neurologic: no seizure no involuntary movements         Labs on Admission:  Basic Metabolic Panel: Recent Labs  Lab 06/12/17 0653  06/13/17 0714 06/14/17 0624 06/15/17 0532 06/16/17 0715 06/17/17 1626  NA 138  --   --  136 138  --  139  K 2.7*   < > 3.3* 3.7 3.4* 3.6 4.3  CL 99*  --   --  98* 101  --  98*  CO2 27  --   --  28 27  --  30  GLUCOSE 73  --   --  82 81  --  174*  BUN 45*  --   --  38* 49*  --  45*  CREATININE 2.47*  --   --  2.14* 2.44*  --  2.31*  CALCIUM 8.1*  --   --  8.0* 8.2*  --  8.5*  MG  --   --   --  1.9  --   --   --   PHOS 3.1  --   --  2.9 3.5  --  3.8   < > = values in this interval not displayed.    Liver Function Tests: Recent Labs  Lab 06/12/17 0653 06/14/17 0624 06/15/17 0532 06/17/17 1626  ALBUMIN 1.8* 1.8* 1.8* 2.0*   No results for input(s): LIPASE, AMYLASE in the last 168 hours. No results for input(s): AMMONIA in the last 168  hours.  CBC: Recent Labs  Lab 06/12/17 0653 06/15/17 0532 06/18/17 0936  WBC 9.3 9.0 10.0  HGB 11.3* 11.9* 12.3*  HCT 35.0* 37.3* 38.7*  MCV 87.3 88.6 88.4  PLT 234 254 239    Cardiac Enzymes: No results for input(s): CKTOTAL, CKMB, CKMBINDEX, TROPONINI in the last 168 hours.  BNP (last 3 results) No results for input(s): BNP in the last 8760 hours.  ProBNP (last 3 results) No results for input(s): PROBNP in the last 8760 hours.  Radiological Exams on Admission: No results found.  Assessment/Plan Active Problems:   Acute on chronic respiratory failure with hypoxia (HCC)   Aspiration pneumonia of both lower lobes due to gastric secretions (HCC)   CKD (chronic kidney disease), stage III (HCC)   COPD with chronic bronchitis (HCC)   1. Acute on chronic respiratory failure with hypoxia patient remains on T collar currently is on 20% FiO2 tolerating PMV fairly well 2. Chronic kidney disease stage III followed by nephrology continue with dialysis 3. COPD at baseline we will continue  with supportive care 4. Pneumonia due to aspiration treated with antibiotics   I have personally seen and evaluated the patient, evaluated laboratory and imaging results, formulated the assessment and plan and placed orders. The Patient requires high complexity decision making for assessment and support.  Case was discussed on Rounds with the Respiratory Therapy Staff  Yevonne Pax, MD [facility] Pulmonary Critical Care Medicine Sleep Medicine

## 2017-06-19 DIAGNOSIS — J449 Chronic obstructive pulmonary disease, unspecified: Secondary | ICD-10-CM | POA: Diagnosis not present

## 2017-06-19 DIAGNOSIS — N183 Chronic kidney disease, stage 3 (moderate): Secondary | ICD-10-CM | POA: Diagnosis not present

## 2017-06-19 DIAGNOSIS — J690 Pneumonitis due to inhalation of food and vomit: Secondary | ICD-10-CM | POA: Diagnosis not present

## 2017-06-19 DIAGNOSIS — J9621 Acute and chronic respiratory failure with hypoxia: Secondary | ICD-10-CM | POA: Diagnosis not present

## 2017-06-19 NOTE — Progress Notes (Signed)
[facility]  ROUNDING NOTE   Subjective:  Patient seen at bedside. His tracheostomy has been capped. Good urine output noted.   Objective:  Vital signs in last 24 hours:  Temperature 97.4 pulse 78 respirations 20 blood pressure 131/60  Physical Exam: General: Ill appearing  Head: Normocephalic, atraumatic. Moist oral mucosal membranes  Eyes: Anicteric  Neck: Tracheostomy is capped  Lungs:  Rhonchi bilateral  Heart: S1S2 no rubs  Abdomen:  Soft, nontender, +PEG  Extremities: no peripheral edema.  Neurologic: Awake, alert, will follow simple commands  Skin: No lesions  Access: RIJ permcath    Basic Metabolic Panel: Recent Labs  Lab 06/13/17 0714 06/14/17 0624 06/15/17 0532 06/16/17 0715 06/17/17 1626  NA  --  136 138  --  139  K 3.3* 3.7 3.4* 3.6 4.3  CL  --  98* 101  --  98*  CO2  --  28 27  --  30  GLUCOSE  --  82 81  --  174*  BUN  --  38* 49*  --  45*  CREATININE  --  2.14* 2.44*  --  2.31*  CALCIUM  --  8.0* 8.2*  --  8.5*  MG  --  1.9  --   --   --   PHOS  --  2.9 3.5  --  3.8    Liver Function Tests: Recent Labs  Lab 06/14/17 0624 06/15/17 0532 06/17/17 1626  ALBUMIN 1.8* 1.8* 2.0*   No results for input(s): LIPASE, AMYLASE in the last 168 hours. No results for input(s): AMMONIA in the last 168 hours.  CBC: Recent Labs  Lab 06/15/17 0532 06/18/17 0936  WBC 9.0 10.0  HGB 11.9* 12.3*  HCT 37.3* 38.7*  MCV 88.6 88.4  PLT 254 239    Cardiac Enzymes: No results for input(s): CKTOTAL, CKMB, CKMBINDEX, TROPONINI in the last 168 hours.  BNP: Invalid input(s): POCBNP  CBG: No results for input(s): GLUCAP in the last 168 hours.  Microbiology: Results for orders placed or performed during the hospital encounter of 06/02/17  C difficile quick scan w PCR reflex     Status: Abnormal   Collection Time: 06/03/17  6:25 AM  Result Value Ref Range Status   C Diff antigen POSITIVE (A) NEGATIVE Final   C Diff toxin NEGATIVE NEGATIVE  Final   C Diff interpretation Results are indeterminate. See PCR results.  Final    Comment: Performed at [facility], [address], [place], [place] [address]  Culture, Urine     Status: None   Collection Time: 06/03/17  6:25 AM  Result Value Ref Range Status   Specimen Description URINE, RANDOM  Final   Special Requests NONE  Final   Culture   Final    NO GROWTH Performed at [facility], [address] [place], [place] [address]    Report Status 06/04/2017 FINAL  Final  C. Diff by PCR, Reflexed     Status: None   Collection Time: 06/03/17  8:09 AM  Result Value Ref Range Status   Toxigenic C. Difficile by PCR NEGATIVE NEGATIVE Final    Comment: Patient is colonized with non toxigenic C. difficile. May not need treatment unless significant symptoms are present. Performed at [facility], [address], [place], [place] [address]   Culture, respiratory (NON-Expectorated)     Status: None   Collection Time: 06/04/17 10:15 AM  Result Value Ref Range Status   Specimen Description  TRACHEAL ASPIRATE  Final   Special Requests NONE  Final   Gram Stain   Final    RARE WBC PRESENT, PREDOMINANTLY MONONUCLEAR RARE SQUAMOUS EPITHELIAL CELLS PRESENT NO ORGANISMS SEEN    Culture   Final    Consistent with normal respiratory flora. Performed at [facility], [address], [place], [place] [address]    Report Status 06/06/2017 FINAL  Final  C difficile quick scan w PCR reflex     Status: Abnormal   Collection Time: 06/09/17  2:26 AM  Result Value Ref Range Status   C Diff antigen POSITIVE (A) NEGATIVE Final   C Diff toxin NEGATIVE NEGATIVE Final   C Diff interpretation Results are indeterminate. See PCR results.  Final    Comment: Performed at [facility], [address], [place], [place] [address]  C. Diff by PCR, Reflexed     Status: None   Collection Time: 06/09/17  2:26 AM  Result Value Ref Range Status   Toxigenic C. Difficile by PCR NEGATIVE  NEGATIVE Final    Comment: Patient is colonized with non toxigenic C. difficile. May not need treatment unless significant symptoms are present. Performed at [facility], [address] [place], [place] [address]     Coagulation Studies: No results for input(s): LABPROT, INR in the last 72 hours.  Urinalysis: No results for input(s): COLORURINE, LABSPEC, PHURINE, GLUCOSEU, HGBUR, BILIRUBINUR, KETONESUR, PROTEINUR, UROBILINOGEN, NITRITE, LEUKOCYTESUR in the last 72 hours.  Invalid input(s): APPERANCEUR    Imaging: No results found.   Medications:       Assessment/ Plan:  Pt is a 83 y.o. white male with a recent PMHx of acute renal failure on chronic kidney disease stage III requiring hemodialysis status post PermCath placement on June 02, 2017,  acute hypoxic respiratory failure status post tracheostomy placement May 29, 2017, dysphagia status post PEG, recurrent aspiration pneumonia, diabetes mellitus type 2, COPD, hypertension, who was admitted to [facility] on 06/02/2017 for ongoing management.   1.  Acute renal failure previously requiring hemodialysis. 2.  Chronic kidney disease stage III baseline creatinine 1.3. 3.  Acute respiratory failure status post tracheostomy placement. 4.  Recurrent aspiration pneumonia. 5.  Anemia of chronic kidney disease.  Plan: No new renal function testing available today.  Most recent creatinine was 2.31.  Good urine output noted at the moment.  Therefore we will continue to hold dialysis treatments.  Recommend erratically monitoring renal parameters.  He has made respiratory improvement as well as his tracheostomy is currently capped.  Overall the patient appears to be making slow steady progress in terms of his general condition.  However his long-term prognosis remains guarded.   LOS: 0 Keiarah Orlowski 5/10/20195:04 PM

## 2017-06-19 NOTE — Progress Notes (Signed)
Pulmonary Critical Care Medicine [facility] [place]   PULMONARY SERVICE  PROGRESS NOTE  Date of Service: 06/19/2017  Mark Farrell  ZOX:[id number]  DOB: September 18, 1934   DOA: 06/02/2017  Referring Physician: Carron Curie, MD  HPI: Mark Farrell is a 83 y.o. male seen for follow up of Acute on Chronic Respiratory Failure.  Patient has done very well weaning with the PMV on T collar has been on 28% FiO2  Medications: Reviewed on Rounds  Physical Exam:  Vitals: Temperature 98.0 pulse 80 respiratory rate 19 blood pressure 151/74 saturation 96%  Ventilator Settings off of the ventilator on 10 collar FiO2 28%  . General: Comfortable at this time . Eyes: Grossly normal lids, irises & conjunctiva . ENT: grossly tongue is normal . Neck: no obvious mass . Cardiovascular: S1-S2 normal no gallop or rub . Respiratory: None rhonchi expansion is equal . Abdomen: Soft and nontender . Skin: no rash seen on limited exam . Musculoskeletal: not rigid . Psychiatric:unable to assess . Neurologic: no seizure no involuntary movements         Labs on Admission:  Basic Metabolic Panel: Recent Labs  Lab 06/13/17 0714 06/14/17 0624 06/15/17 0532 06/16/17 0715 06/17/17 1626  NA  --  136 138  --  139  K 3.3* 3.7 3.4* 3.6 4.3  CL  --  98* 101  --  98*  CO2  --  28 27  --  30  GLUCOSE  --  82 81  --  174*  BUN  --  38* 49*  --  45*  CREATININE  --  2.14* 2.44*  --  2.31*  CALCIUM  --  8.0* 8.2*  --  8.5*  MG  --  1.9  --   --   --   PHOS  --  2.9 3.5  --  3.8    Liver Function Tests: Recent Labs  Lab 06/14/17 0624 06/15/17 0532 06/17/17 1626  ALBUMIN 1.8* 1.8* 2.0*   No results for input(s): LIPASE, AMYLASE in the last 168 hours. No results for input(s): AMMONIA in the last 168 hours.  CBC: Recent Labs  Lab 06/15/17 0532 06/18/17 0936  WBC 9.0 10.0  HGB 11.9* 12.3*  HCT 37.3* 38.7*  MCV 88.6 88.4  PLT 254 239    Cardiac Enzymes: No results for input(s):  CKTOTAL, CKMB, CKMBINDEX, TROPONINI in the last 168 hours.  BNP (last 3 results) No results for input(s): BNP in the last 8760 hours.  ProBNP (last 3 results) No results for input(s): PROBNP in the last 8760 hours.  Radiological Exams on Admission: No results found.  Assessment/Plan Active Problems:   Acute on chronic respiratory failure with hypoxia (HCC)   Aspiration pneumonia of both lower lobes due to gastric secretions (HCC)   CKD (chronic kidney disease), stage III (HCC)   COPD with chronic bronchitis (HCC)   1. Acute on chronic respiratory failure with hypoxia we will continue with weaning on T collar trials currently on 20% FiO2 advance towards capping trials as discussed on rounds. 2. Pneumonia due to aspiration at baseline we will continue supportive care 3. Chronic kidney disease stage III at baseline we will continue to follow 4. COPD with chronic bronchitis at baseline we will continue with supportive care   I have personally seen and evaluated the patient, evaluated laboratory and imaging results, formulated the assessment and plan and placed orders. The Patient requires high complexity decision making for assessment and support.  Case was discussed  on Rounds with the Respiratory Therapy Staff  Allyne Gee, MD [facility] Pulmonary Critical Care Medicine Sleep Medicine

## 2017-06-20 DIAGNOSIS — J449 Chronic obstructive pulmonary disease, unspecified: Secondary | ICD-10-CM | POA: Diagnosis not present

## 2017-06-20 DIAGNOSIS — J690 Pneumonitis due to inhalation of food and vomit: Secondary | ICD-10-CM | POA: Diagnosis not present

## 2017-06-20 DIAGNOSIS — N183 Chronic kidney disease, stage 3 (moderate): Secondary | ICD-10-CM | POA: Diagnosis not present

## 2017-06-20 DIAGNOSIS — J9621 Acute and chronic respiratory failure with hypoxia: Secondary | ICD-10-CM | POA: Diagnosis not present

## 2017-06-20 LAB — BASIC METABOLIC PANEL
ANION GAP: 12 (ref 5–15)
BUN: 63 mg/dL — ABNORMAL HIGH (ref 6–20)
CHLORIDE: 98 mmol/L — AB (ref 101–111)
CO2: 32 mmol/L (ref 22–32)
CREATININE: 2.46 mg/dL — AB (ref 0.61–1.24)
Calcium: 8.7 mg/dL — ABNORMAL LOW (ref 8.9–10.3)
GFR calc non Af Amer: 23 mL/min — ABNORMAL LOW (ref >60)
GFR, EST AFRICAN AMERICAN: 27 mL/min — AB (ref >60)
Glucose, Bld: 107 mg/dL — ABNORMAL HIGH (ref 65–99)
POTASSIUM: 3.1 mmol/L — AB (ref 3.5–5.1)
Sodium: 142 mmol/L (ref 135–145)

## 2017-06-20 NOTE — Progress Notes (Signed)
Pulmonary Critical Care Medicine [facility] [place]   PULMONARY SERVICE  PROGRESS NOTE  Date of Service: 06/20/2017  Mark Farrell  ZOX:[id number]  DOB: [birth year]   DOA: 06/02/2017  Referring Physician: Carron Curie, MD  HPI: Mark Farrell is a 83 y.o. male seen for follow up of Acute on Chronic Respiratory Failure.  Doing well with capping has been capped for more than 24 hours good saturations are noted  Medications: Reviewed on Rounds  Physical Exam:  Vitals: Temperature 97.5 pulse 69 respiratory rate 20 blood pressure 147/79 saturations 96%  Ventilator Settings off the ventilator capping  . General: Comfortable at this time . Eyes: Grossly normal lids, irises & conjunctiva . ENT: grossly tongue is normal . Neck: no obvious mass . Cardiovascular: S1-S2 normal no gallop or rub . Respiratory: No rhonchi expansion is equal . Abdomen: Soft and nontender . Skin: no rash seen on limited exam . Musculoskeletal: not rigid . Psychiatric:unable to assess . Neurologic: no seizure no involuntary movements         Labs on Admission:  Basic Metabolic Panel: Recent Labs  Lab 06/14/17 0624 06/15/17 0532 06/16/17 0715 06/17/17 1626 06/20/17 0656  NA 136 138  --  139 142  K 3.7 3.4* 3.6 4.3 3.1*  CL 98* 101  --  98* 98*  CO2 28 27  --  30 32  GLUCOSE 82 81  --  174* 107*  BUN 38* 49*  --  45* 63*  CREATININE 2.14* 2.44*  --  2.31* 2.46*  CALCIUM 8.0* 8.2*  --  8.5* 8.7*  MG 1.9  --   --   --   --   PHOS 2.9 3.5  --  3.8  --     Liver Function Tests: Recent Labs  Lab 06/14/17 0624 06/15/17 0532 06/17/17 1626  ALBUMIN 1.8* 1.8* 2.0*   No results for input(s): LIPASE, AMYLASE in the last 168 hours. No results for input(s): AMMONIA in the last 168 hours.  CBC: Recent Labs  Lab 06/15/17 0532 06/18/17 0936  WBC 9.0 10.0  HGB 11.9* 12.3*  HCT 37.3* 38.7*  MCV 88.6 88.4  PLT 254 239    Cardiac Enzymes: No results for input(s): CKTOTAL,  CKMB, CKMBINDEX, TROPONINI in the last 168 hours.  BNP (last 3 results) No results for input(s): BNP in the last 8760 hours.  ProBNP (last 3 results) No results for input(s): PROBNP in the last 8760 hours.  Radiological Exams on Admission: No results found.  Assessment/Plan Active Problems:   Acute on chronic respiratory failure with hypoxia (HCC)   Aspiration pneumonia of both lower lobes due to gastric secretions (HCC)   CKD (chronic kidney disease), stage III (HCC)   COPD with chronic bronchitis (HCC)   1. Acute on chronic respiratory failure with hypoxia we will continue with capping trials hopefully moving towards decannulation. 2. Chronic kidney disease stage III followed by nephrology continue with supportive care 3. COPD with colitis at baseline we will continue to monitor 4. Aspiration pneumonia supportive care   I have personally seen and evaluated the patient, evaluated laboratory and imaging results, formulated the assessment and plan and placed orders. The Patient requires high complexity decision making for assessment and support.  Case was discussed on Rounds with the Respiratory Therapy Staff  Yevonne Pax, MD [facility] Pulmonary Critical Care Medicine Sleep Medicine

## 2017-06-21 DIAGNOSIS — J690 Pneumonitis due to inhalation of food and vomit: Secondary | ICD-10-CM | POA: Diagnosis not present

## 2017-06-21 DIAGNOSIS — J9621 Acute and chronic respiratory failure with hypoxia: Secondary | ICD-10-CM | POA: Diagnosis not present

## 2017-06-21 DIAGNOSIS — N183 Chronic kidney disease, stage 3 (moderate): Secondary | ICD-10-CM | POA: Diagnosis not present

## 2017-06-21 DIAGNOSIS — J449 Chronic obstructive pulmonary disease, unspecified: Secondary | ICD-10-CM | POA: Diagnosis not present

## 2017-06-21 LAB — POTASSIUM: Potassium: 3.2 mmol/L — ABNORMAL LOW (ref 3.5–5.1)

## 2017-06-21 NOTE — Progress Notes (Signed)
Pulmonary Critical Care Medicine [facility] [place]   PULMONARY SERVICE  PROGRESS NOTE  Date of Service: 06/21/2017  Mark Farrell  [id number]  DOB: August 08, 1934   DOA: 06/02/2017  Referring Physician: Carron Curie, MD  HPI: Mark Farrell is a 83 y.o. male seen for follow up of Acute on Chronic Respiratory Failure.  Continues to wean and has been doing well with capping secretions are somewhat thick and needs pulmonary toilet  Medications: Reviewed on Rounds  Physical Exam:  Vitals: Temperature 96.8 pulse 76 respiratory rate 16 blood pressure 151/76 saturations 94%  Ventilator Settings capping will be continued  . General: Comfortable at this time . Eyes: Grossly normal lids, irises & conjunctiva . ENT: grossly tongue is normal . Neck: no obvious mass . Cardiovascular: S1-S2 normal no gallop or rub . Respiratory: No rhonchi expansion equal . Abdomen: Soft nontender . Skin: no rash seen on limited exam . Musculoskeletal: not rigid . Psychiatric:unable to assess . Neurologic: no seizure no involuntary movements         Labs on Admission:  Basic Metabolic Panel: Recent Labs  Lab 06/15/17 0532 06/16/17 0715 06/17/17 1626 06/20/17 0656 06/21/17 0701  NA 138  --  139 142  --   K 3.4* 3.6 4.3 3.1* 3.2*  CL 101  --  98* 98*  --   CO2 27  --  30 32  --   GLUCOSE 81  --  174* 107*  --   BUN 49*  --  45* 63*  --   CREATININE 2.44*  --  2.31* 2.46*  --   CALCIUM 8.2*  --  8.5* 8.7*  --   PHOS 3.5  --  3.8  --   --     Liver Function Tests: Recent Labs  Lab 06/15/17 0532 06/17/17 1626  ALBUMIN 1.8* 2.0*   No results for input(s): LIPASE, AMYLASE in the last 168 hours. No results for input(s): AMMONIA in the last 168 hours.  CBC: Recent Labs  Lab 06/15/17 0532 06/18/17 0936  WBC 9.0 10.0  HGB 11.9* 12.3*  HCT 37.3* 38.7*  MCV 88.6 88.4  PLT 254 239    Cardiac Enzymes: No results for input(s): CKTOTAL, CKMB, CKMBINDEX, TROPONINI in  the last 168 hours.  BNP (last 3 results) No results for input(s): BNP in the last 8760 hours.  ProBNP (last 3 results) No results for input(s): PROBNP in the last 8760 hours.  Radiological Exams on Admission: No results found.  Assessment/Plan Active Problems:   Acute on chronic respiratory failure with hypoxia (HCC)   Aspiration pneumonia of both lower lobes due to gastric secretions (HCC)   CKD (chronic kidney disease), stage III (HCC)   COPD with chronic bronchitis (HCC)   1. Acute on chronic respiratory failure with hypoxia we will continue with full supportive care continue pulmonary toilet hopefully will be able to decannulate soon 2. Chronic kidney disease stage III followed by nephrology dialyze as necessary 3. COPD with chronic bronchitis at baseline continue with supportive care 4. Pneumonia due to aspiration at baseline we will continue to monitor   I have personally seen and evaluated the patient, evaluated laboratory and imaging results, formulated the assessment and plan and placed orders. The Patient requires high complexity decision making for assessment and support.  Case was discussed on Rounds with the Respiratory Therapy Staff  Yevonne Pax, MD [facility] Pulmonary Critical Care Medicine Sleep Medicine

## 2017-06-22 DIAGNOSIS — J9621 Acute and chronic respiratory failure with hypoxia: Secondary | ICD-10-CM | POA: Diagnosis not present

## 2017-06-22 DIAGNOSIS — J449 Chronic obstructive pulmonary disease, unspecified: Secondary | ICD-10-CM | POA: Diagnosis not present

## 2017-06-22 DIAGNOSIS — N183 Chronic kidney disease, stage 3 (moderate): Secondary | ICD-10-CM | POA: Diagnosis not present

## 2017-06-22 DIAGNOSIS — J690 Pneumonitis due to inhalation of food and vomit: Secondary | ICD-10-CM | POA: Diagnosis not present

## 2017-06-22 LAB — URINALYSIS, ROUTINE W REFLEX MICROSCOPIC
Bilirubin Urine: NEGATIVE
Glucose, UA: NEGATIVE mg/dL
KETONES UR: NEGATIVE mg/dL
Nitrite: NEGATIVE
PH: 9 — AB (ref 5.0–8.0)
Protein, ur: 100 mg/dL — AB
SPECIFIC GRAVITY, URINE: 1.011 (ref 1.005–1.030)

## 2017-06-22 LAB — CBC
HEMATOCRIT: 39.2 % (ref 39.0–52.0)
Hemoglobin: 11.9 g/dL — ABNORMAL LOW (ref 13.0–17.0)
MCH: 26.8 pg (ref 26.0–34.0)
MCHC: 30.4 g/dL (ref 30.0–36.0)
MCV: 88.3 fL (ref 78.0–100.0)
Platelets: 304 10*3/uL (ref 150–400)
RBC: 4.44 MIL/uL (ref 4.22–5.81)
RDW: 16.2 % — AB (ref 11.5–15.5)
WBC: 16.6 10*3/uL — ABNORMAL HIGH (ref 4.0–10.5)

## 2017-06-22 LAB — BASIC METABOLIC PANEL
ANION GAP: 11 (ref 5–15)
BUN: 70 mg/dL — AB (ref 6–20)
CO2: 33 mmol/L — ABNORMAL HIGH (ref 22–32)
Calcium: 9 mg/dL (ref 8.9–10.3)
Chloride: 98 mmol/L — ABNORMAL LOW (ref 101–111)
Creatinine, Ser: 2.3 mg/dL — ABNORMAL HIGH (ref 0.61–1.24)
GFR calc non Af Amer: 25 mL/min — ABNORMAL LOW (ref >60)
GFR, EST AFRICAN AMERICAN: 29 mL/min — AB (ref >60)
GLUCOSE: 78 mg/dL (ref 65–99)
POTASSIUM: 3.7 mmol/L (ref 3.5–5.1)
SODIUM: 142 mmol/L (ref 135–145)

## 2017-06-22 NOTE — Progress Notes (Signed)
Pulmonary Critical Care Medicine [facility] [place]   PULMONARY SERVICE  PROGRESS NOTE  Date of Service: 06/22/2017  Mark Farrell  VWU:[id number]  DOB: 04/23/1934   DOA: 06/02/2017  Referring Physician: Carron Curie, MD  HPI: Mark Farrell is a 83 y.o. male seen for follow up of Acute on Chronic Respiratory Failure.  Patient is doing fine on T collar trials has been on 28% oxygen  Medications: Reviewed on Rounds  Physical Exam:  Vitals: Temperature 97.4 pulse 93 respiratory rate 16 blood pressure 161/80 saturations 97%  Ventilator Settings aerosolized T collar FiO2 28%  . General: Comfortable at this time . Eyes: Grossly normal lids, irises & conjunctiva . ENT: grossly tongue is normal . Neck: no obvious mass . Cardiovascular: S1-S2 normal no gallop . Respiratory: No rhonchi noted . Abdomen: Soft nontender . Skin: no rash seen on limited exam . Musculoskeletal: not rigid . Psychiatric:unable to assess . Neurologic: no seizure no involuntary movements         Labs on Admission:  Basic Metabolic Panel: Recent Labs  Lab 06/16/17 0715 06/17/17 1626 06/20/17 0656 06/21/17 0701 06/22/17 0724  NA  --  139 142  --  142  K 3.6 4.3 3.1* 3.2* 3.7  CL  --  98* 98*  --  98*  CO2  --  30 32  --  33*  GLUCOSE  --  174* 107*  --  78  BUN  --  45* 63*  --  70*  CREATININE  --  2.31* 2.46*  --  2.30*  CALCIUM  --  8.5* 8.7*  --  9.0  PHOS  --  3.8  --   --   --     Liver Function Tests: Recent Labs  Lab 06/17/17 1626  ALBUMIN 2.0*   No results for input(s): LIPASE, AMYLASE in the last 168 hours. No results for input(s): AMMONIA in the last 168 hours.  CBC: Recent Labs  Lab 06/18/17 0936 06/22/17 0724  WBC 10.0 16.6*  HGB 12.3* 11.9*  HCT 38.7* 39.2  MCV 88.4 88.3  PLT 239 304    Cardiac Enzymes: No results for input(s): CKTOTAL, CKMB, CKMBINDEX, TROPONINI in the last 168 hours.  BNP (last 3 results) No results for input(s): BNP in the  last 8760 hours.  ProBNP (last 3 results) No results for input(s): PROBNP in the last 8760 hours.  Radiological Exams on Admission: No results found.  Assessment/Plan Active Problems:   Acute on chronic respiratory failure with hypoxia (HCC)   Aspiration pneumonia of both lower lobes due to gastric secretions (HCC)   CKD (chronic kidney disease), stage III (HCC)   COPD with chronic bronchitis (HCC)   1. Acute on chronic respiratory failure with hypoxia continue T collar weans as tolerated continue pulmonary toilet secretion management 2. Aspiration pneumonia stable at this time follow x-rays 3. Chronic kidney disease stage III followed by nephrology will follow the recommendations 4. COPD with chronic bronchitis stable at this time   I have personally seen and evaluated the patient, evaluated laboratory and imaging results, formulated the assessment and plan and placed orders. The Patient requires high complexity decision making for assessment and support.  Case was discussed on Rounds with the Respiratory Therapy Staff  Yevonne Pax, MD [facility] Pulmonary Critical Care Medicine Sleep Medicine

## 2017-06-22 NOTE — Progress Notes (Signed)
[facility]  ROUNDING NOTE   Subjective:  Renal function was last checked on 06/20/2017. At that time creatinine was 2.46. Patient still making urine.   Objective:  Vital signs in last 24 hours:  Temperature 97.4 pulse 93 respirations 16 blood pressure 161/80  Physical Exam: General: Ill appearing  Head: Normocephalic, atraumatic. Moist oral mucosal membranes  Eyes: Anicteric  Neck: Tracheostomy   Lungs:  Rhonchi bilateral, normal effort  Heart: S1S2 no rubs  Abdomen:  Soft, nontender, +PEG  Extremities: no peripheral edema.  Neurologic: Awake, alert, will follow simple commands  Skin: No lesions  Access: RIJ permcath    Basic Metabolic Panel: Recent Labs  Lab 06/16/17 0715 06/17/17 1626 06/20/17 0656 06/21/17 0701  NA  --  139 142  --   K 3.6 4.3 3.1* 3.2*  CL  --  98* 98*  --   CO2  --  30 32  --   GLUCOSE  --  174* 107*  --   BUN  --  45* 63*  --   CREATININE  --  2.31* 2.46*  --   CALCIUM  --  8.5* 8.7*  --   PHOS  --  3.8  --   --     Liver Function Tests: Recent Labs  Lab 06/17/17 1626  ALBUMIN 2.0*   No results for input(s): LIPASE, AMYLASE in the last 168 hours. No results for input(s): AMMONIA in the last 168 hours.  CBC: Recent Labs  Lab 06/18/17 0936 06/22/17 0724  WBC 10.0 16.6*  HGB 12.3* 11.9*  HCT 38.7* 39.2  MCV 88.4 88.3  PLT 239 304    Cardiac Enzymes: No results for input(s): CKTOTAL, CKMB, CKMBINDEX, TROPONINI in the last 168 hours.  BNP: Invalid input(s): POCBNP  CBG: No results for input(s): GLUCAP in the last 168 hours.  Microbiology: Results for orders placed or performed during the hospital encounter of 06/02/17  C difficile quick scan w PCR reflex     Status: Abnormal   Collection Time: 06/03/17  6:25 AM  Result Value Ref Range Status   C Diff antigen POSITIVE (A) NEGATIVE Final   C Diff toxin NEGATIVE NEGATIVE Final   C Diff interpretation Results are indeterminate. See PCR results.  Final   Comment: Performed at [facility], [place] [address] [place], [place] [address]  Culture, Urine     Status: None   Collection Time: 06/03/17  6:25 AM  Result Value Ref Range Status   Specimen Description URINE, RANDOM  Final   Special Requests NONE  Final   Culture   Final    NO GROWTH Performed at [facility], [place] [address], [place], [place] [address]    Report Status 06/04/2017 FINAL  Final  C. Diff by PCR, Reflexed     Status: None   Collection Time: 06/03/17  8:09 AM  Result Value Ref Range Status   Toxigenic C. Difficile by PCR NEGATIVE NEGATIVE Final    Comment: Patient is colonized with non toxigenic C. difficile. May not need treatment unless significant symptoms are present. Performed at [facility], [place] [address] [place], [place] [address]   Culture, respiratory (NON-Expectorated)     Status: None   Collection Time: 06/04/17 10:15 AM  Result Value Ref Range Status   Specimen Description TRACHEAL ASPIRATE  Final   Special Requests NONE  Final   Gram Stain   Final    RARE WBC PRESENT, PREDOMINANTLY MONONUCLEAR RARE  SQUAMOUS EPITHELIAL CELLS PRESENT NO ORGANISMS SEEN    Culture   Final    Consistent with normal respiratory flora. Performed at [facility], [place] [address], [place], [place] [address]    Report Status 06/06/2017 FINAL  Final  C difficile quick scan w PCR reflex     Status: Abnormal   Collection Time: 06/09/17  2:26 AM  Result Value Ref Range Status   C Diff antigen POSITIVE (A) NEGATIVE Final   C Diff toxin NEGATIVE NEGATIVE Final   C Diff interpretation Results are indeterminate. See PCR results.  Final    Comment: Performed at [facility], [place] [address], [place], [place] [address]  C. Diff by PCR, Reflexed     Status: None   Collection Time: 06/09/17  2:26 AM  Result Value Ref Range Status   Toxigenic C. Difficile by PCR NEGATIVE NEGATIVE Final    Comment: Patient is colonized with non toxigenic C. difficile. May  not need treatment unless significant symptoms are present. Performed at [facility], [place] [address] [place],  [address]     Coagulation Studies: No results for input(s): LABPROT, INR in the last 72 hours.  Urinalysis: No results for input(s): COLORURINE, LABSPEC, PHURINE, GLUCOSEU, HGBUR, BILIRUBINUR, KETONESUR, PROTEINUR, UROBILINOGEN, NITRITE, LEUKOCYTESUR in the last 72 hours.  Invalid input(s): APPERANCEUR    Imaging: No results found.   Medications:       Assessment/ Plan:  Pt is a 83 y.o. white male with a recent PMHx of acute renal failure on chronic kidney disease stage III requiring hemodialysis status post PermCath placement on June 02, 2017,  acute hypoxic respiratory failure status post tracheostomy placement May 29, 2017, dysphagia status post PEG, recurrent aspiration pneumonia, diabetes mellitus type 2, COPD, hypertension, who was admitted to [facility] on 06/02/2017 for ongoing management.   1.  Acute renal failure previously requiring hemodialysis. 2.  Chronic kidney disease stage III baseline creatinine 1.3. 3.  Acute respiratory failure status post tracheostomy placement. 4.  Recurrent aspiration pneumonia. 5.  Anemia of chronic kidney disease.  Plan: Patient seen at bedside.  No new creatinine available today.  Recommend checking renal function periodically.  Most recent creatinine was 2.46 with an EGFR of 23.  Patient making adequate amounts of urine.  Unclear as to whether he will achieve his prior baseline given the severity of acute renal failure.  He appears to be doing well with the tracheostomy and placed as well as aerosolized trach collar.  Most recent hemoglobin has improved and currently 11.9.   LOS: 0 Murel Wigle 5/13/20198:27 AM

## 2017-06-23 DIAGNOSIS — J449 Chronic obstructive pulmonary disease, unspecified: Secondary | ICD-10-CM | POA: Diagnosis not present

## 2017-06-23 DIAGNOSIS — J690 Pneumonitis due to inhalation of food and vomit: Secondary | ICD-10-CM | POA: Diagnosis not present

## 2017-06-23 DIAGNOSIS — N183 Chronic kidney disease, stage 3 (moderate): Secondary | ICD-10-CM | POA: Diagnosis not present

## 2017-06-23 DIAGNOSIS — J9621 Acute and chronic respiratory failure with hypoxia: Secondary | ICD-10-CM | POA: Diagnosis not present

## 2017-06-23 LAB — RENAL FUNCTION PANEL
ALBUMIN: 2.1 g/dL — AB (ref 3.5–5.0)
ANION GAP: 10 (ref 5–15)
BUN: 72 mg/dL — AB (ref 6–20)
CO2: 33 mmol/L — AB (ref 22–32)
Calcium: 9 mg/dL (ref 8.9–10.3)
Chloride: 98 mmol/L — ABNORMAL LOW (ref 101–111)
Creatinine, Ser: 2.26 mg/dL — ABNORMAL HIGH (ref 0.61–1.24)
GFR calc Af Amer: 30 mL/min — ABNORMAL LOW (ref >60)
GFR calc non Af Amer: 26 mL/min — ABNORMAL LOW (ref >60)
Glucose, Bld: 144 mg/dL — ABNORMAL HIGH (ref 65–99)
POTASSIUM: 3.9 mmol/L (ref 3.5–5.1)
Phosphorus: 3.9 mg/dL (ref 2.5–4.6)
Sodium: 141 mmol/L (ref 135–145)

## 2017-06-23 LAB — CBC
HEMATOCRIT: 37.8 % — AB (ref 39.0–52.0)
HEMOGLOBIN: 11.3 g/dL — AB (ref 13.0–17.0)
MCH: 26.4 pg (ref 26.0–34.0)
MCHC: 29.9 g/dL — AB (ref 30.0–36.0)
MCV: 88.3 fL (ref 78.0–100.0)
Platelets: 282 10*3/uL (ref 150–400)
RBC: 4.28 MIL/uL (ref 4.22–5.81)
RDW: 16.2 % — AB (ref 11.5–15.5)
WBC: 9 10*3/uL (ref 4.0–10.5)

## 2017-06-23 LAB — MAGNESIUM: Magnesium: 1.9 mg/dL (ref 1.7–2.4)

## 2017-06-23 NOTE — Progress Notes (Signed)
Pulmonary Critical Care Medicine [facility] [place]   PULMONARY SERVICE  PROGRESS NOTE  Date of Service: 06/23/2017  Mark Farrell  ZOX:[id number]  DOB: 06/11/1934   DOA: 06/02/2017  Referring Physician: Carron Curie, MD  HPI: Mark Farrell is a 83 y.o. male seen for follow up of Acute on Chronic Respiratory Failure.  Patient is doing well has been capping currently is on 2 L nasal cannula he is neurologically unchanged  Medications: Reviewed on Rounds  Physical Exam:  Vitals: T 98.4 pulse 72 respiratory rate 10 blood pressure 118/69 saturations 100%  Ventilator Settings capping trials on 2 L  . General: Comfortable at this time . Eyes: Grossly normal lids, irises & conjunctiva . ENT: grossly tongue is normal . Neck: no obvious mass . Cardiovascular: S1-S2 normal no gallop or rub . Respiratory: No rhonchi expansion is equal . Abdomen: Soft nontender . Skin: no rash seen on limited exam . Musculoskeletal: not rigid . Psychiatric:unable to assess . Neurologic: no seizure no involuntary movements         Labs on Admission:  Basic Metabolic Panel: Recent Labs  Lab 06/17/17 1626 06/20/17 0656 06/21/17 0701 06/22/17 0724 06/23/17 0540  NA 139 142  --  142 141  K 4.3 3.1* 3.2* 3.7 3.9  CL 98* 98*  --  98* 98*  CO2 30 32  --  33* 33*  GLUCOSE 174* 107*  --  78 144*  BUN 45* 63*  --  70* 72*  CREATININE 2.31* 2.46*  --  2.30* 2.26*  CALCIUM 8.5* 8.7*  --  9.0 9.0  MG  --   --   --   --  1.9  PHOS 3.8  --   --   --  3.9    Liver Function Tests: Recent Labs  Lab 06/17/17 1626 06/23/17 0540  ALBUMIN 2.0* 2.1*   No results for input(s): LIPASE, AMYLASE in the last 168 hours. No results for input(s): AMMONIA in the last 168 hours.  CBC: Recent Labs  Lab 06/18/17 0936 06/22/17 0724 06/23/17 0540  WBC 10.0 16.6* 9.0  HGB 12.3* 11.9* 11.3*  HCT 38.7* 39.2 37.8*  MCV 88.4 88.3 88.3  PLT 239 304 282    Cardiac Enzymes: No results for  input(s): CKTOTAL, CKMB, CKMBINDEX, TROPONINI in the last 168 hours.  BNP (last 3 results) No results for input(s): BNP in the last 8760 hours.  ProBNP (last 3 results) No results for input(s): PROBNP in the last 8760 hours.  Radiological Exams on Admission: No results found.  Assessment/Plan Active Problems:   Acute on chronic respiratory failure with hypoxia (HCC)   Aspiration pneumonia of both lower lobes due to gastric secretions (HCC)   CKD (chronic kidney disease), stage III (HCC)   COPD with chronic bronchitis (HCC)   1. Acute on chronic respiratory failure with hypoxia we will continue with weaning on capping as ordered 2. We will continue pulmonary toilet secretion management supportive care 3. Aspiration pneumonia continue pulmonary toilet secretion management 4. Chronic kidney disease stage III followed by nephrology for dialysis 5. COPD severe disease we will monitor   I have personally seen and evaluated the patient, evaluated laboratory and imaging results, formulated the assessment and plan and placed orders. The Patient requires high complexity decision making for assessment and support.  Case was discussed on Rounds with the Respiratory Therapy Staff  Yevonne Pax, MD [facility] Pulmonary Critical Care Medicine Sleep Medicine

## 2017-06-24 DIAGNOSIS — J690 Pneumonitis due to inhalation of food and vomit: Secondary | ICD-10-CM | POA: Diagnosis not present

## 2017-06-24 DIAGNOSIS — J449 Chronic obstructive pulmonary disease, unspecified: Secondary | ICD-10-CM | POA: Diagnosis not present

## 2017-06-24 DIAGNOSIS — N183 Chronic kidney disease, stage 3 (moderate): Secondary | ICD-10-CM | POA: Diagnosis not present

## 2017-06-24 DIAGNOSIS — J9621 Acute and chronic respiratory failure with hypoxia: Secondary | ICD-10-CM | POA: Diagnosis not present

## 2017-06-24 LAB — CULTURE, RESPIRATORY W GRAM STAIN: Culture: NORMAL

## 2017-06-24 LAB — CULTURE, RESPIRATORY

## 2017-06-24 LAB — URINE CULTURE

## 2017-06-24 NOTE — Progress Notes (Signed)
[facility]  ROUNDING NOTE   Subjective:  Patient doing better. His tracheostomy has been capped. Appears to be doing well off of dialysis.   Objective:  Vital signs in last 24 hours:  Temperature 97.2 pulse 77 respirations 18 blood pressure 153/94  Physical Exam: General: Ill appearing  Head: Normocephalic, atraumatic. Moist oral mucosal membranes  Eyes: Anicteric  Neck:   Lungs:  Rhonchi bilateral, normal effort  Heart: S1S2 no rubs  Abdomen:  Soft, nontender, +PEG  Extremities: no peripheral edema.  Neurologic: Awake, alert, will follow simple commands  Skin: No lesions  Access: RIJ permcath    Basic Metabolic Panel: Recent Labs  Lab 06/17/17 1626 06/20/17 0656 06/21/17 0701 06/22/17 0724 06/23/17 0540  NA 139 142  --  142 141  K 4.3 3.1* 3.2* 3.7 3.9  CL 98* 98*  --  98* 98*  CO2 30 32  --  33* 33*  GLUCOSE 174* 107*  --  78 144*  BUN 45* 63*  --  70* 72*  CREATININE 2.31* 2.46*  --  2.30* 2.26*  CALCIUM 8.5* 8.7*  --  9.0 9.0  MG  --   --   --   --  1.9  PHOS 3.8  --   --   --  3.9    Liver Function Tests: Recent Labs  Lab 06/17/17 1626 06/23/17 0540  ALBUMIN 2.0* 2.1*   No results for input(s): LIPASE, AMYLASE in the last 168 hours. No results for input(s): AMMONIA in the last 168 hours.  CBC: Recent Labs  Lab 06/18/17 0936 06/22/17 0724 06/23/17 0540  WBC 10.0 16.6* 9.0  HGB 12.3* 11.9* 11.3*  HCT 38.7* 39.2 37.8*  MCV 88.4 88.3 88.3  PLT 239 304 282    Cardiac Enzymes: No results for input(s): CKTOTAL, CKMB, CKMBINDEX, TROPONINI in the last 168 hours.  BNP: Invalid input(s): POCBNP  CBG: No results for input(s): GLUCAP in the last 168 hours.  Microbiology: Results for orders placed or performed during the hospital encounter of 06/02/17  C difficile quick scan w PCR reflex     Status: Abnormal   Collection Time: 06/03/17  6:25 AM  Result Value Ref Range Status   C Diff antigen POSITIVE (A) NEGATIVE Final   C  Diff toxin NEGATIVE NEGATIVE Final   C Diff interpretation Results are indeterminate. See PCR results.  Final    Comment: Performed at [facility], [address] [place], [place] [address]  Culture, Urine     Status: None   Collection Time: 06/03/17  6:25 AM  Result Value Ref Range Status   Specimen Description URINE, RANDOM  Final   Special Requests NONE  Final   Culture   Final    NO GROWTH Performed at [facility], [address], [place], [place] [address]    Report Status 06/04/2017 FINAL  Final  C. Diff by PCR, Reflexed     Status: None   Collection Time: 06/03/17  8:09 AM  Result Value Ref Range Status   Toxigenic C. Difficile by PCR NEGATIVE NEGATIVE Final    Comment: Patient is colonized with non toxigenic C. difficile. May not need treatment unless significant symptoms are present. Performed at [facility], [address], [place], [place] [address]   Culture, respiratory (NON-Expectorated)     Status: None   Collection Time: 06/04/17 10:15 AM  Result Value Ref Range Status   Specimen Description TRACHEAL ASPIRATE  Final  Special Requests NONE  Final   Gram Stain   Final    RARE WBC PRESENT, PREDOMINANTLY MONONUCLEAR RARE SQUAMOUS EPITHELIAL CELLS PRESENT NO ORGANISMS SEEN    Culture   Final    Consistent with normal respiratory flora. Performed at [facility], [address] [place], [place] [address]    Report Status 06/06/2017 FINAL  Final  C difficile quick scan w PCR reflex     Status: Abnormal   Collection Time: 06/09/17  2:26 AM  Result Value Ref Range Status   C Diff antigen POSITIVE (A) NEGATIVE Final   C Diff toxin NEGATIVE NEGATIVE Final   C Diff interpretation Results are indeterminate. See PCR results.  Final    Comment: Performed at [facility], [address] [place], [place] [address]  C. Diff by PCR, Reflexed     Status: None   Collection Time: 06/09/17  2:26 AM  Result Value Ref Range Status   Toxigenic  C. Difficile by PCR NEGATIVE NEGATIVE Final    Comment: Patient is colonized with non toxigenic C. difficile. May not need treatment unless significant symptoms are present. Performed at [facility], [address], [place], [place] [address]   Culture, respiratory (NON-Expectorated)     Status: None (Preliminary result)   Collection Time: 06/22/17 12:44 PM  Result Value Ref Range Status   Specimen Description TRACHEAL ASPIRATE  Final   Special Requests NONE  Final   Gram Stain   Final    FEW WBC PRESENT, PREDOMINANTLY PMN RARE SQUAMOUS EPITHELIAL CELLS PRESENT RARE GRAM NEGATIVE RODS RARE GRAM POSITIVE RODS RARE GRAM POSITIVE COCCI IN PAIRS IN SINGLES    Culture   Final    CULTURE REINCUBATED FOR BETTER GROWTH Performed at [facility], [address], [place], [place] [address]    Report Status PENDING  Incomplete  Culture, Urine     Status: Abnormal   Collection Time: 06/22/17  3:50 PM  Result Value Ref Range Status   Specimen Description URINE, RANDOM  Final   Special Requests   Final    NONE Performed at [facility], [address], [place], [place] [address]    Culture >=100,000 COLONIES/mL PSEUDOMONAS AERUGINOSA (A)  Final   Report Status 06/24/2017 FINAL  Final   Organism ID, Bacteria PSEUDOMONAS AERUGINOSA (A)  Final      Susceptibility   Pseudomonas aeruginosa - MIC*    CEFTAZIDIME 4 SENSITIVE Sensitive     CIPROFLOXACIN 2 INTERMEDIATE Intermediate     GENTAMICIN <=1 SENSITIVE Sensitive     IMIPENEM 2 SENSITIVE Sensitive     PIP/TAZO 8 SENSITIVE Sensitive     CEFEPIME 4 SENSITIVE Sensitive     * >=100,000 COLONIES/mL PSEUDOMONAS AERUGINOSA    Coagulation Studies: No results for input(s): LABPROT, INR in the last 72 hours.  Urinalysis: Recent Labs    06/22/17 1550  COLORURINE YELLOW  LABSPEC 1.011  PHURINE 9.0*  GLUCOSEU NEGATIVE  HGBUR LARGE*  BILIRUBINUR NEGATIVE  KETONESUR NEGATIVE  PROTEINUR 100*  NITRITE NEGATIVE   LEUKOCYTESUR LARGE*      Imaging: No results found.   Medications:       Assessment/ Plan:  Pt is a 83 y.o. white male with a recent PMHx of acute renal failure on chronic kidney disease stage III requiring hemodialysis status post PermCath placement on June 02, 2017,  acute hypoxic respiratory failure status post tracheostomy placement May 29, 2017, dysphagia status post PEG,  recurrent aspiration pneumonia, diabetes mellitus type 2, COPD, hypertension, who was admitted to [facility] on 06/02/2017 for ongoing management.   1.  Acute renal failure previously requiring hemodialysis. 2.  Chronic kidney disease stage III baseline creatinine 1.3. 3.  Acute respiratory failure status post tracheostomy placement. 4.  Recurrent aspiration pneumonia. 5.  Anemia of chronic kidney disease.  Plan: The patient's creatinine continues to trend down slowly and is currently 2.26.  However BUN is trending up and is currently 72.  This may be secondary to tube feeds.  Consider decreasing protein concentration of feeds.  Albumin has trended up and is currently 2.1.  His anemia also appears to be relatively stable with a hemoglobin of 11.3.  We will continue to monitor his progress.   LOS: 0 Mark Farrell 5/15/201912:21 PM

## 2017-06-24 NOTE — Progress Notes (Signed)
Pulmonary Critical Care Medicine [facility] [place]   PULMONARY SERVICE  PROGRESS NOTE  Date of Service: 06/24/2017  Mark Farrell  WUJ:[id number]  DOB: [birth year]   DOA: 06/02/2017  Referring Physician: Carron Curie, MD  HPI: Mark Farrell is a 83 y.o. male seen for follow up of Acute on Chronic Respiratory Failure.  Patient is doing well with capping trials is on 2 L nasal cannula good saturations are noted  Medications: Reviewed on Rounds  Physical Exam:  Vitals:  Temperature 97.2 pulse 77 respiratory rate 18 blood pressure 153/84 saturations 95%  Ventilator Settings off the ventilator  . General: Comfortable at this time . Eyes: Grossly normal lids, irises & conjunctiva . ENT: grossly tongue is normal . Neck: no obvious mass . Cardiovascular: S1-S2 normal no gallop . Respiratory: No rhonchi noted . Abdomen: Soft nontender . Skin: no rash seen on limited exam . Musculoskeletal: not rigid . Psychiatric:unable to assess . Neurologic: no seizure no involuntary movements         Labs on Admission:  Basic Metabolic Panel: Recent Labs  Lab 06/17/17 1626 06/20/17 0656 06/21/17 0701 06/22/17 0724 06/23/17 0540  NA 139 142  --  142 141  K 4.3 3.1* 3.2* 3.7 3.9  CL 98* 98*  --  98* 98*  CO2 30 32  --  33* 33*  GLUCOSE 174* 107*  --  78 144*  BUN 45* 63*  --  70* 72*  CREATININE 2.31* 2.46*  --  2.30* 2.26*  CALCIUM 8.5* 8.7*  --  9.0 9.0  MG  --   --   --   --  1.9  PHOS 3.8  --   --   --  3.9    Liver Function Tests: Recent Labs  Lab 06/17/17 1626 06/23/17 0540  ALBUMIN 2.0* 2.1*   No results for input(s): LIPASE, AMYLASE in the last 168 hours. No results for input(s): AMMONIA in the last 168 hours.  CBC: Recent Labs  Lab 06/18/17 0936 06/22/17 0724 06/23/17 0540  WBC 10.0 16.6* 9.0  HGB 12.3* 11.9* 11.3*  HCT 38.7* 39.2 37.8*  MCV 88.4 88.3 88.3  PLT 239 304 282    Cardiac Enzymes: No results for input(s): CKTOTAL, CKMB,  CKMBINDEX, TROPONINI in the last 168 hours.  BNP (last 3 results) No results for input(s): BNP in the last 8760 hours.  ProBNP (last 3 results) No results for input(s): PROBNP in the last 8760 hours.  Radiological Exams on Admission: No results found.  Assessment/Plan Active Problems:   Acute on chronic respiratory failure with hypoxia (HCC)   Aspiration pneumonia of both lower lobes due to gastric secretions (HCC)   CKD (chronic kidney disease), stage III (HCC)   COPD with chronic bronchitis (HCC)   1. Acute on chronic respiratory failure with hypoxia we will continue with capping trials as ordered continue pulmonary toilet supportive care patient's saturations are as noted acceptable 2. Chronic kidney disease stage III on dialysis patient is being followed by nephrology currently is off dialysis 3. COPD stable we will continue to monitor 4. Pneumonia due to aspiration treated we will follow along   I have personally seen and evaluated the patient, evaluated laboratory and imaging results, formulated the assessment and plan and placed orders. The Patient requires high complexity decision making for assessment and support.  Case was discussed on Rounds with the Respiratory Therapy Staff  Yevonne Pax, MD [facility] Pulmonary Critical Care Medicine Sleep Medicine

## 2017-06-25 DIAGNOSIS — J449 Chronic obstructive pulmonary disease, unspecified: Secondary | ICD-10-CM | POA: Diagnosis not present

## 2017-06-25 DIAGNOSIS — J690 Pneumonitis due to inhalation of food and vomit: Secondary | ICD-10-CM | POA: Diagnosis not present

## 2017-06-25 DIAGNOSIS — N183 Chronic kidney disease, stage 3 (moderate): Secondary | ICD-10-CM | POA: Diagnosis not present

## 2017-06-25 DIAGNOSIS — J9621 Acute and chronic respiratory failure with hypoxia: Secondary | ICD-10-CM | POA: Diagnosis not present

## 2017-06-25 NOTE — Progress Notes (Signed)
Pulmonary Critical Care Medicine [facility] [place]   PULMONARY SERVICE  PROGRESS NOTE  Date of Service: 06/25/2017  Mark Farrell  ZOX:[id number]  DOB: 1934/08/24   DOA: 06/02/2017  Referring Physician: Carron Curie, MD  HPI: Mark Farrell is a 83 y.o. male seen for follow up of Acute on Chronic Respiratory Failure.  Capping right now doing well patient has been on no oxygen at home  Medications: Reviewed on Rounds  Physical Exam:  Vitals: Temperature 98.4 pulse 81 respiratory rate 12 blood pressure 128/76 saturation 90%  Ventilator Settings off the ventilator  . General: Comfortable at this time . Eyes: Grossly normal lids, irises & conjunctiva . ENT: grossly tongue is normal . Neck: no obvious mass . Cardiovascular: S1-S2 normal no gallop or rub . Respiratory: No rhonchi expansion is equal . Abdomen: Soft nontender . Skin: no rash seen on limited exam . Musculoskeletal: not rigid . Psychiatric:unable to assess . Neurologic: no seizure no involuntary movements         Labs on Admission:  Basic Metabolic Panel: Recent Labs  Lab 06/20/17 0656 06/21/17 0701 06/22/17 0724 06/23/17 0540  NA 142  --  142 141  K 3.1* 3.2* 3.7 3.9  CL 98*  --  98* 98*  CO2 32  --  33* 33*  GLUCOSE 107*  --  78 144*  BUN 63*  --  70* 72*  CREATININE 2.46*  --  2.30* 2.26*  CALCIUM 8.7*  --  9.0 9.0  MG  --   --   --  1.9  PHOS  --   --   --  3.9    Liver Function Tests: Recent Labs  Lab 06/23/17 0540  ALBUMIN 2.1*   No results for input(s): LIPASE, AMYLASE in the last 168 hours. No results for input(s): AMMONIA in the last 168 hours.  CBC: Recent Labs  Lab 06/22/17 0724 06/23/17 0540  WBC 16.6* 9.0  HGB 11.9* 11.3*  HCT 39.2 37.8*  MCV 88.3 88.3  PLT 304 282    Cardiac Enzymes: No results for input(s): CKTOTAL, CKMB, CKMBINDEX, TROPONINI in the last 168 hours.  BNP (last 3 results) No results for input(s): BNP in the last 8760  hours.  ProBNP (last 3 results) No results for input(s): PROBNP in the last 8760 hours.  Radiological Exams on Admission: No results found.  Assessment/Plan Active Problems:   Acute on chronic respiratory failure with hypoxia (HCC)   Aspiration pneumonia of both lower lobes due to gastric secretions (HCC)   CKD (chronic kidney disease), stage III (HCC)   COPD with chronic bronchitis (HCC)   1. Acute on chronic respiratory failure with hypoxia we will continue with capping trials patient does not really have much in the way of secretions so I am hopeful that we should be able to decannulate 2. Pneumonia due to aspiration treated resolved 3. Chronic kidney disease stage III followed by nephrology will continue to monitor 4. COPD severe disease at baseline   I have personally seen and evaluated the patient, evaluated laboratory and imaging results, formulated the assessment and plan and placed orders. The Patient requires high complexity decision making for assessment and support.  Case was discussed on Rounds with the Respiratory Therapy Staff  Yevonne Pax, MD [facility] Pulmonary Critical Care Medicine Sleep Medicine

## 2017-06-26 ENCOUNTER — Other Ambulatory Visit (HOSPITAL_COMMUNITY): Payer: Medicaid Other

## 2017-06-26 ENCOUNTER — Encounter (HOSPITAL_COMMUNITY): Payer: Self-pay | Admitting: Interventional Radiology

## 2017-06-26 DIAGNOSIS — J449 Chronic obstructive pulmonary disease, unspecified: Secondary | ICD-10-CM | POA: Diagnosis not present

## 2017-06-26 DIAGNOSIS — N183 Chronic kidney disease, stage 3 (moderate): Secondary | ICD-10-CM | POA: Diagnosis not present

## 2017-06-26 DIAGNOSIS — J690 Pneumonitis due to inhalation of food and vomit: Secondary | ICD-10-CM | POA: Diagnosis not present

## 2017-06-26 DIAGNOSIS — J9621 Acute and chronic respiratory failure with hypoxia: Secondary | ICD-10-CM | POA: Diagnosis not present

## 2017-06-26 HISTORY — PX: IR REMOVAL TUN CV CATH W/O FL: IMG2289

## 2017-06-26 MED ORDER — CHLORHEXIDINE GLUCONATE 4 % EX LIQD
CUTANEOUS | Status: DC | PRN
  Administered 2017-06-26: 1 via TOPICAL

## 2017-06-26 MED ORDER — LIDOCAINE HCL (PF) 1 % IJ SOLN
INTRAMUSCULAR | Status: DC | PRN
  Administered 2017-06-26: 10 mL

## 2017-06-26 MED ORDER — LIDOCAINE HCL 1 % IJ SOLN
INTRAMUSCULAR | Status: AC
  Filled 2017-06-26: qty 20

## 2017-06-26 MED ORDER — CHLORHEXIDINE GLUCONATE 4 % EX LIQD
CUTANEOUS | Status: AC
  Filled 2017-06-26: qty 15

## 2017-06-26 NOTE — Progress Notes (Signed)
[facility]  ROUNDING NOTE   Subjective:  Patient seen at bedside. Patient was sleeping this a.m. but was arousable. Follow simple commands.   Objective:  Vital signs in last 24 hours:  Temperature 97.8 pulse 92 respirations 30 blood pressure 135/73  Physical Exam: General: Chronically ill appearing  Head: Normocephalic, atraumatic. Moist oral mucosal membranes  Eyes: Anicteric  Neck: Tracheostomy in place, normal effort  Lungs:  Rhonchi bilateral, normal effort  Heart: S1S2 no rubs  Abdomen:  Soft, nontender, +PEG  Extremities: no peripheral edema.  Neurologic: Arousable, will follow simple commands  Skin: No lesions  Access: RIJ permcath    Basic Metabolic Panel: Recent Labs  Lab 06/20/17 0656 06/21/17 0701 06/22/17 0724 06/23/17 0540  NA 142  --  142 141  K 3.1* 3.2* 3.7 3.9  CL 98*  --  98* 98*  CO2 32  --  33* 33*  GLUCOSE 107*  --  78 144*  BUN 63*  --  70* 72*  CREATININE 2.46*  --  2.30* 2.26*  CALCIUM 8.7*  --  9.0 9.0  MG  --   --   --  1.9  PHOS  --   --   --  3.9    Liver Function Tests: Recent Labs  Lab 06/23/17 0540  ALBUMIN 2.1*   No results for input(s): LIPASE, AMYLASE in the last 168 hours. No results for input(s): AMMONIA in the last 168 hours.  CBC: Recent Labs  Lab 06/22/17 0724 06/23/17 0540  WBC 16.6* 9.0  HGB 11.9* 11.3*  HCT 39.2 37.8*  MCV 88.3 88.3  PLT 304 282    Cardiac Enzymes: No results for input(s): CKTOTAL, CKMB, CKMBINDEX, TROPONINI in the last 168 hours.  BNP: Invalid input(s): POCBNP  CBG: No results for input(s): GLUCAP in the last 168 hours.  Microbiology: Results for orders placed or performed during the hospital encounter of 06/02/17  C difficile quick scan w PCR reflex     Status: Abnormal   Collection Time: 06/03/17  6:25 AM  Result Value Ref Range Status   C Diff antigen POSITIVE (A) NEGATIVE Final   C Diff toxin NEGATIVE NEGATIVE Final   C Diff interpretation Results are  indeterminate. See PCR results.  Final    Comment: Performed at [facility], [address], [place], [place] [address]  Culture, Urine     Status: None   Collection Time: 06/03/17  6:25 AM  Result Value Ref Range Status   Specimen Description URINE, RANDOM  Final   Special Requests NONE  Final   Culture   Final    NO GROWTH Performed at [facility], [address], [place], [place] [address]    Report Status 06/04/2017 FINAL  Final  C. Diff by PCR, Reflexed     Status: None   Collection Time: 06/03/17  8:09 AM  Result Value Ref Range Status   Toxigenic C. Difficile by PCR NEGATIVE NEGATIVE Final    Comment: Patient is colonized with non toxigenic C. difficile. May not need treatment unless significant symptoms are present. Performed at [facility], [address] [place], [place] [address]   Culture, respiratory (NON-Expectorated)     Status: None   Collection Time: 06/04/17 10:15 AM  Result Value Ref Range Status   Specimen Description TRACHEAL ASPIRATE  Final   Special Requests NONE  Final   Gram Stain   Final    RARE WBC PRESENT, PREDOMINANTLY MONONUCLEAR  RARE SQUAMOUS EPITHELIAL CELLS PRESENT NO ORGANISMS SEEN    Culture   Final    Consistent with normal respiratory flora. Performed at [facility], [address], [place], [place] [address]    Report Status 06/06/2017 FINAL  Final  C difficile quick scan w PCR reflex     Status: Abnormal   Collection Time: 06/09/17  2:26 AM  Result Value Ref Range Status   C Diff antigen POSITIVE (A) NEGATIVE Final   C Diff toxin NEGATIVE NEGATIVE Final   C Diff interpretation Results are indeterminate. See PCR results.  Final    Comment: Performed at [facility], [address] [place], [place] [address]  C. Diff by PCR, Reflexed     Status: None   Collection Time: 06/09/17  2:26 AM  Result Value Ref Range Status   Toxigenic C. Difficile by PCR NEGATIVE NEGATIVE Final    Comment: Patient is  colonized with non toxigenic C. difficile. May not need treatment unless significant symptoms are present. Performed at [facility], [address] [place], [place] [address]   Culture, respiratory (NON-Expectorated)     Status: None   Collection Time: 06/22/17 12:44 PM  Result Value Ref Range Status   Specimen Description TRACHEAL ASPIRATE  Final   Special Requests NONE  Final   Gram Stain   Final    FEW WBC PRESENT, PREDOMINANTLY PMN RARE SQUAMOUS EPITHELIAL CELLS PRESENT RARE GRAM NEGATIVE RODS RARE GRAM POSITIVE RODS RARE GRAM POSITIVE COCCI IN PAIRS IN SINGLES    Culture   Final    Consistent with normal respiratory flora. Performed at [facility], [address] [place], [place] [address]    Report Status 06/24/2017 FINAL  Final  Culture, Urine     Status: Abnormal   Collection Time: 06/22/17  3:50 PM  Result Value Ref Range Status   Specimen Description URINE, RANDOM  Final   Special Requests   Final    NONE Performed at [facility], [address] [place], [place] [address]    Culture >=100,000 COLONIES/mL PSEUDOMONAS AERUGINOSA (A)  Final   Report Status 06/24/2017 FINAL  Final   Organism ID, Bacteria PSEUDOMONAS AERUGINOSA (A)  Final      Susceptibility   Pseudomonas aeruginosa - MIC*    CEFTAZIDIME 4 SENSITIVE Sensitive     CIPROFLOXACIN 2 INTERMEDIATE Intermediate     GENTAMICIN <=1 SENSITIVE Sensitive     IMIPENEM 2 SENSITIVE Sensitive     PIP/TAZO 8 SENSITIVE Sensitive     CEFEPIME 4 SENSITIVE Sensitive     * >=100,000 COLONIES/mL PSEUDOMONAS AERUGINOSA    Coagulation Studies: No results for input(s): LABPROT, INR in the last 72 hours.  Urinalysis: No results for input(s): COLORURINE, LABSPEC, PHURINE, GLUCOSEU, HGBUR, BILIRUBINUR, KETONESUR, PROTEINUR, UROBILINOGEN, NITRITE, LEUKOCYTESUR in the last 72 hours.  Invalid input(s): APPERANCEUR    Imaging: No results found.   Medications:       Assessment/ Plan:  Pt is a  83 y.o. white male with a recent PMHx of acute renal failure on chronic kidney disease stage III requiring hemodialysis status post PermCath placement on June 02, 2017,  acute hypoxic respiratory failure status post tracheostomy placement May 29, 2017, dysphagia status post PEG, recurrent aspiration pneumonia, diabetes mellitus type 2, COPD, hypertension, who was admitted to [facility] on 06/02/2017 for ongoing management.   1.  Acute renal failure previously requiring hemodialysis. 2.  Chronic kidney disease stage  III baseline creatinine 1.3. 3.  Acute respiratory failure status post tracheostomy placement. 4.  Recurrent aspiration pneumonia. 5.  Anemia of chronic kidney disease.  Plan: No new renal function testing available today.  Creatinine currently 2.2 at last check.  Recommend rechecking renal parameters today.  Overall has improved significantly as his tracheostomy has been capped and patient has been transitioned off of dialysis.  Still very weak however.  Continue nutritional support as well as physical therapy as tolerated.  Additional plan as per hospitalist.  We will discontinue his permcath at this time.     LOS: 0 Janki Dike 5/17/20198:52 AM

## 2017-06-26 NOTE — Progress Notes (Signed)
Pulmonary Critical Care Medicine [facility] [place]   PULMONARY SERVICE  PROGRESS NOTE  Date of Service: 06/26/2017  PACER DORN  NFA:[id number]  DOB: November 15, 1934   DOA: 06/02/2017  Referring Physician: Carron Curie, MD  HPI: Mark Farrell is a 83 y.o. male seen for follow up of Acute on Chronic Respiratory Failure.  He continues to remain capped looks good at this time.  Had to have some suctioning done overnight for about 3 or 4 times reportedly.  Right now is on room air  Medications: Reviewed on Rounds  Physical Exam:  Vitals: Temperature 97.8 pulse 92 respiratory rate 27 blood pressure 135/73 saturations 94%  Ventilator Settings capping on room air  . General: Comfortable at this time . Eyes: Grossly normal lids, irises & conjunctiva . ENT: grossly tongue is normal . Neck: no obvious mass . Cardiovascular: S1-S2 normal no gallop or rub . Respiratory: No rhonchi expansion equal . Abdomen: Soft nontender . Skin: no rash seen on limited exam . Musculoskeletal: not rigid . Psychiatric:unable to assess . Neurologic: no seizure no involuntary movements         Labs on Admission:  Basic Metabolic Panel: Recent Labs  Lab 06/20/17 0656 06/21/17 0701 06/22/17 0724 06/23/17 0540  NA 142  --  142 141  K 3.1* 3.2* 3.7 3.9  CL 98*  --  98* 98*  CO2 32  --  33* 33*  GLUCOSE 107*  --  78 144*  BUN 63*  --  70* 72*  CREATININE 2.46*  --  2.30* 2.26*  CALCIUM 8.7*  --  9.0 9.0  MG  --   --   --  1.9  PHOS  --   --   --  3.9    Liver Function Tests: Recent Labs  Lab 06/23/17 0540  ALBUMIN 2.1*   No results for input(s): LIPASE, AMYLASE in the last 168 hours. No results for input(s): AMMONIA in the last 168 hours.  CBC: Recent Labs  Lab 06/22/17 0724 06/23/17 0540  WBC 16.6* 9.0  HGB 11.9* 11.3*  HCT 39.2 37.8*  MCV 88.3 88.3  PLT 304 282    Cardiac Enzymes: No results for input(s): CKTOTAL, CKMB, CKMBINDEX, TROPONINI in the last 168  hours.  BNP (last 3 results) No results for input(s): BNP in the last 8760 hours.  ProBNP (last 3 results) No results for input(s): PROBNP in the last 8760 hours.  Radiological Exams on Admission: No results found.  Assessment/Plan Active Problems:   Acute on chronic respiratory failure with hypoxia (HCC)   Aspiration pneumonia of both lower lobes due to gastric secretions (HCC)   CKD (chronic kidney disease), stage III (HCC)   COPD with chronic bronchitis (HCC)   1. Acute on chronic respiratory failure with hypoxia we will continue with capping continue pulmonary toilet supportive care patient's saturations are acceptable 2. Pneumonia due to aspiration clinically resolved we will continue to follow along 3. COPD severe disease we will continue with supportive care 4. Chronic kidney disease stage III followed by nephrology for dialysis    I have personally seen and evaluated the patient, evaluated laboratory and imaging results, formulated the assessment and plan and placed orders. The Patient requires high complexity decision making for assessment and support.  Case was discussed on Rounds with the Respiratory Therapy Staff  Yevonne Pax, MD [facility] Pulmonary Critical Care Medicine Sleep Medicine

## 2017-06-26 NOTE — Progress Notes (Addendum)
Patient ID: Mark Farrell, male   DOB: 1934-05-30, 83 y.o.   MRN: [id number]   Request for R tunneled dialysis catheter removal  Per Dr Cherylann Ratel note this am: Pt is  a82 y.o.white malewith arecentPMHx ofacute renal failure on chronic kidney disease stage III requiring hemodialysis status post PermCath placement on June 02, 2017,acute hypoxic respiratory failure status post tracheostomy placement May 29, 2017, dysphagia status post PEG, recurrent aspiration pneumonia, diabetes mellitus type 2, COPD, hypertension, [facility]   1. Acute renal failure previously requiring hemodialysis. 2. Chronic kidney disease stage III baseline creatinine 1.3. 3. Acute respiratory failure status post tracheostomy placement. 4. Recurrent aspiration pneumonia. 5. Anemia of chronic kidney disease.  Plan: No new renal function testing available today.  Creatinine currently 2.2 at last check.  Recommend rechecking renal parameters today.  Overall has improved significantly as his tracheostomy has been capped and patient has been transitioned off of dialysis.  Still very weak however.  Continue nutritional support as well as physical therapy as tolerated.  Additional plan as per hospitalist.  We will discontinue his permcath at this time.    Scheduled now for removal  Consented with Carles Collet via phone Consent signed and in IR

## 2017-06-27 DIAGNOSIS — J690 Pneumonitis due to inhalation of food and vomit: Secondary | ICD-10-CM | POA: Diagnosis not present

## 2017-06-27 DIAGNOSIS — J449 Chronic obstructive pulmonary disease, unspecified: Secondary | ICD-10-CM | POA: Diagnosis not present

## 2017-06-27 DIAGNOSIS — J9621 Acute and chronic respiratory failure with hypoxia: Secondary | ICD-10-CM | POA: Diagnosis not present

## 2017-06-27 DIAGNOSIS — N183 Chronic kidney disease, stage 3 (moderate): Secondary | ICD-10-CM | POA: Diagnosis not present

## 2017-06-27 LAB — RENAL FUNCTION PANEL
ANION GAP: 13 (ref 5–15)
Albumin: 2.4 g/dL — ABNORMAL LOW (ref 3.5–5.0)
BUN: 78 mg/dL — ABNORMAL HIGH (ref 6–20)
CHLORIDE: 103 mmol/L (ref 101–111)
CO2: 27 mmol/L (ref 22–32)
Calcium: 9.3 mg/dL (ref 8.9–10.3)
Creatinine, Ser: 2.27 mg/dL — ABNORMAL HIGH (ref 0.61–1.24)
GFR calc Af Amer: 30 mL/min — ABNORMAL LOW (ref >60)
GFR calc non Af Amer: 26 mL/min — ABNORMAL LOW (ref >60)
GLUCOSE: 159 mg/dL — AB (ref 65–99)
PHOSPHORUS: 3.9 mg/dL (ref 2.5–4.6)
Potassium: 3.7 mmol/L (ref 3.5–5.1)
Sodium: 143 mmol/L (ref 135–145)

## 2017-06-27 LAB — CBC
HEMATOCRIT: 41 % (ref 39.0–52.0)
Hemoglobin: 12.2 g/dL — ABNORMAL LOW (ref 13.0–17.0)
MCH: 26.2 pg (ref 26.0–34.0)
MCHC: 29.8 g/dL — AB (ref 30.0–36.0)
MCV: 88.2 fL (ref 78.0–100.0)
Platelets: 314 10*3/uL (ref 150–400)
RBC: 4.65 MIL/uL (ref 4.22–5.81)
RDW: 15.7 % — AB (ref 11.5–15.5)
WBC: 11.1 10*3/uL — ABNORMAL HIGH (ref 4.0–10.5)

## 2017-06-27 LAB — MAGNESIUM: Magnesium: 1.9 mg/dL (ref 1.7–2.4)

## 2017-06-27 NOTE — Progress Notes (Signed)
Pulmonary Critical Care Medicine [facility] [place]   PULMONARY SERVICE  PROGRESS NOTE  Date of Service: 06/27/2017  Mark Farrell  ZOX:[id number]  DOB: 1934/12/18   DOA: 06/02/2017  Referring Physician: Carron Curie, MD  HPI: Mark Farrell is a 83 y.o. male seen for follow up of Acute on Chronic Respiratory Failure.  Patient is capping right now doing fairly well no distress is noted at this time  Medications: Reviewed on Rounds  Physical Exam:  Vitals: Temperature 96.8 pulse 91 respiratory 32 blood pressure 150/85 saturations 96%  Ventilator Settings capping  . General: Comfortable at this time . Eyes: Grossly normal lids, irises & conjunctiva . ENT: grossly tongue is normal . Neck: no obvious mass . Cardiovascular: S1 S2 normal no gallop . Respiratory: No rhonchi noted . Abdomen: soft . Skin: no rash seen on limited exam . Musculoskeletal: not rigid . Psychiatric:unable to assess . Neurologic: no seizure no involuntary movements         Labs on Admission:  Basic Metabolic Panel: Recent Labs  Lab 06/21/17 0701 06/22/17 0724 06/23/17 0540 06/27/17 0822  NA  --  142 141 143  K 3.2* 3.7 3.9 3.7  CL  --  98* 98* 103  CO2  --  33* 33* 27  GLUCOSE  --  78 144* 159*  BUN  --  70* 72* 78*  CREATININE  --  2.30* 2.26* 2.27*  CALCIUM  --  9.0 9.0 9.3  MG  --   --  1.9 1.9  PHOS  --   --  3.9 3.9    Liver Function Tests: Recent Labs  Lab 06/23/17 0540 06/27/17 0822  ALBUMIN 2.1* 2.4*   No results for input(s): LIPASE, AMYLASE in the last 168 hours. No results for input(s): AMMONIA in the last 168 hours.  CBC: Recent Labs  Lab 06/22/17 0724 06/23/17 0540 06/27/17 0822  WBC 16.6* 9.0 11.1*  HGB 11.9* 11.3* 12.2*  HCT 39.2 37.8* 41.0  MCV 88.3 88.3 88.2  PLT 304 282 314    Cardiac Enzymes: No results for input(s): CKTOTAL, CKMB, CKMBINDEX, TROPONINI in the last 168 hours.  BNP (last 3 results) No results for input(s): BNP in  the last 8760 hours.  ProBNP (last 3 results) No results for input(s): PROBNP in the last 8760 hours.  Radiological Exams on Admission: Ir Removal Tun Cv Cath W/o Fl  Result Date: 06/26/2017 CLINICAL DATA:  History of acute on chronic renal failure, improved. Tunneled right IJ hemodialysis catheter, no longer needed. EXAM: TUNNELED HEMODIALYSIS CATHETER REMOVAL TECHNIQUE: Overlying skin prepped with chlorhexidine, draped in usual sterile fashion, infiltrated locally with 1% lidocaine. The previously placed tunneled right IJ hemodialysis catheter was dissected free from the underlying soft tissues and removed intact. Hemostasis was achieved. Site covered with a sterile dressing. The patient tolerated the procedure well. COMPLICATIONS: none IMPRESSION: 1. Technically successful tunneled hemodialysis catheter removal. Electronically Signed   By: Corlis Leak M.D.   On: 06/26/2017 14:53    Assessment/Plan Active Problems:   Acute on chronic respiratory failure with hypoxia (HCC)   Aspiration pneumonia of both lower lobes due to gastric secretions (HCC)   CKD (chronic kidney disease), stage III (HCC)   COPD with chronic bronchitis (HCC)   1. Acute on chronic respiratory failure with hypoxia continue with capping trials as tolerated patient is requiring some frequent suctioning we will continue with supportive care 2. Pneumonia due to aspiration treated with antibiotics we will monitor 3. CKD  stage III followed by nephrology dialyze as needed 4. COPD at baseline we will continue present management   I have personally seen and evaluated the patient, evaluated laboratory and imaging results, formulated the assessment and plan and placed orders. The Patient requires high complexity decision making for assessment and support.  Case was discussed on Rounds with the Respiratory Therapy Staff  Yevonne Pax, MD [facility] Pulmonary Critical Care Medicine Sleep Medicine

## 2017-06-29 DIAGNOSIS — J9621 Acute and chronic respiratory failure with hypoxia: Secondary | ICD-10-CM | POA: Diagnosis not present

## 2017-06-29 DIAGNOSIS — N183 Chronic kidney disease, stage 3 (moderate): Secondary | ICD-10-CM | POA: Diagnosis not present

## 2017-06-29 DIAGNOSIS — J690 Pneumonitis due to inhalation of food and vomit: Secondary | ICD-10-CM | POA: Diagnosis not present

## 2017-06-29 DIAGNOSIS — J449 Chronic obstructive pulmonary disease, unspecified: Secondary | ICD-10-CM | POA: Diagnosis not present

## 2017-06-29 LAB — CBC
HEMATOCRIT: 38.7 % — AB (ref 39.0–52.0)
HEMOGLOBIN: 11.5 g/dL — AB (ref 13.0–17.0)
MCH: 26.3 pg (ref 26.0–34.0)
MCHC: 29.7 g/dL — AB (ref 30.0–36.0)
MCV: 88.6 fL (ref 78.0–100.0)
Platelets: 280 10*3/uL (ref 150–400)
RBC: 4.37 MIL/uL (ref 4.22–5.81)
RDW: 15.5 % (ref 11.5–15.5)
WBC: 9.7 10*3/uL (ref 4.0–10.5)

## 2017-06-29 LAB — RENAL FUNCTION PANEL
ALBUMIN: 2.3 g/dL — AB (ref 3.5–5.0)
Anion gap: 15 (ref 5–15)
BUN: 75 mg/dL — ABNORMAL HIGH (ref 6–20)
CHLORIDE: 104 mmol/L (ref 101–111)
CO2: 26 mmol/L (ref 22–32)
Calcium: 9.5 mg/dL (ref 8.9–10.3)
Creatinine, Ser: 2.14 mg/dL — ABNORMAL HIGH (ref 0.61–1.24)
GFR calc Af Amer: 32 mL/min — ABNORMAL LOW (ref >60)
GFR calc non Af Amer: 27 mL/min — ABNORMAL LOW (ref >60)
GLUCOSE: 134 mg/dL — AB (ref 65–99)
PHOSPHORUS: 3.9 mg/dL (ref 2.5–4.6)
POTASSIUM: 4.2 mmol/L (ref 3.5–5.1)
Sodium: 145 mmol/L (ref 135–145)

## 2017-06-29 LAB — MAGNESIUM: Magnesium: 1.9 mg/dL (ref 1.7–2.4)

## 2017-06-29 NOTE — Progress Notes (Signed)
[facility]  ROUNDING NOTE   Subjective:  Patient seen at bedside. Tracheostomy remains capped. Urine output was 1.4 L over the preceding 24 hours.   Objective:  Vital signs in last 24 hours:  Temperature 97.9 pulse 89 respirations 36 blood pressure 163/94  Physical Exam: General: Chronically ill appearing  Head: Normocephalic, atraumatic. Moist oral mucosal membranes  Eyes: Anicteric  Neck: Tracheostomy in place, normal effort  Lungs:  Rhonchi bilateral, normal effort  Heart: S1S2 no rubs  Abdomen:  Soft, nontender, +PEG  Extremities: no peripheral edema.  Neurologic:  Awake, follows simple commands.  Skin: No lesions  Access: RIJ permcath removed.    Basic Metabolic Panel: Recent Labs  Lab 06/23/17 0540 06/27/17 0822 06/29/17 0649  NA 141 143  --   K 3.9 3.7  --   CL 98* 103  --   CO2 33* 27  --   GLUCOSE 144* 159*  --   BUN 72* 78*  --   CREATININE 2.26* 2.27*  --   CALCIUM 9.0 9.3  --   MG 1.9 1.9 1.9  PHOS 3.9 3.9  --     Liver Function Tests: Recent Labs  Lab 06/23/17 0540 06/27/17 0822  ALBUMIN 2.1* 2.4*   No results for input(s): LIPASE, AMYLASE in the last 168 hours. No results for input(s): AMMONIA in the last 168 hours.  CBC: Recent Labs  Lab 06/23/17 0540 06/27/17 0822 06/29/17 0649  WBC 9.0 11.1* 9.7  HGB 11.3* 12.2* 11.5*  HCT 37.8* 41.0 38.7*  MCV 88.3 88.2 88.6  PLT 282 314 280    Cardiac Enzymes: No results for input(s): CKTOTAL, CKMB, CKMBINDEX, TROPONINI in the last 168 hours.  BNP: Invalid input(s): POCBNP  CBG: No results for input(s): GLUCAP in the last 168 hours.  Microbiology: Results for orders placed or performed during the hospital encounter of 06/02/17  C difficile quick scan w PCR reflex     Status: Abnormal   Collection Time: 06/03/17  6:25 AM  Result Value Ref Range Status   C Diff antigen POSITIVE (A) NEGATIVE Final   C Diff toxin NEGATIVE NEGATIVE Final   C Diff interpretation Results  are indeterminate. See PCR results.  Final    Comment: Performed at [facility], [address] [place], [place] [address]  Culture, Urine     Status: None   Collection Time: 06/03/17  6:25 AM  Result Value Ref Range Status   Specimen Description URINE, RANDOM  Final   Special Requests NONE  Final   Culture   Final    NO GROWTH Performed at [facility], [address] [place], [place] [address]    Report Status 06/04/2017 FINAL  Final  C. Diff by PCR, Reflexed     Status: None   Collection Time: 06/03/17  8:09 AM  Result Value Ref Range Status   Toxigenic C. Difficile by PCR NEGATIVE NEGATIVE Final    Comment: Patient is colonized with non toxigenic C. difficile. May not need treatment unless significant symptoms are present. Performed at [facility], [address] [place], [place] [address]   Culture, respiratory (NON-Expectorated)     Status: None   Collection Time: 06/04/17 10:15 AM  Result Value Ref Range Status   Specimen Description TRACHEAL ASPIRATE  Final   Special Requests NONE  Final   Gram Stain   Final    RARE WBC PRESENT, PREDOMINANTLY MONONUCLEAR RARE SQUAMOUS EPITHELIAL CELLS PRESENT NO ORGANISMS  SEEN    Culture   Final    Consistent with normal respiratory flora. Performed at [facility], [address], [place], [place] [address]    Report Status 06/06/2017 FINAL  Final  C difficile quick scan w PCR reflex     Status: Abnormal   Collection Time: 06/09/17  2:26 AM  Result Value Ref Range Status   C Diff antigen POSITIVE (A) NEGATIVE Final   C Diff toxin NEGATIVE NEGATIVE Final   C Diff interpretation Results are indeterminate. See PCR results.  Final    Comment: Performed at [facility], [address], [place], [place] [address]  C. Diff by PCR, Reflexed     Status: None   Collection Time: 06/09/17  2:26 AM  Result Value Ref Range Status   Toxigenic C. Difficile by PCR NEGATIVE NEGATIVE Final    Comment: Patient is  colonized with non toxigenic C. difficile. May not need treatment unless significant symptoms are present. Performed at [facility], [address] [place], [place] [address]   Culture, respiratory (NON-Expectorated)     Status: None   Collection Time: 06/22/17 12:44 PM  Result Value Ref Range Status   Specimen Description TRACHEAL ASPIRATE  Final   Special Requests NONE  Final   Gram Stain   Final    FEW WBC PRESENT, PREDOMINANTLY PMN RARE SQUAMOUS EPITHELIAL CELLS PRESENT RARE GRAM NEGATIVE RODS RARE GRAM POSITIVE RODS RARE GRAM POSITIVE COCCI IN PAIRS IN SINGLES    Culture   Final    Consistent with normal respiratory flora. Performed at [facility], [address], [place], [place] [address]    Report Status 06/24/2017 FINAL  Final  Culture, Urine     Status: Abnormal   Collection Time: 06/22/17  3:50 PM  Result Value Ref Range Status   Specimen Description URINE, RANDOM  Final   Special Requests   Final    NONE Performed at [facility], [address], [place], [place] [address]    Culture >=100,000 COLONIES/mL PSEUDOMONAS AERUGINOSA (A)  Final   Report Status 06/24/2017 FINAL  Final   Organism ID, Bacteria PSEUDOMONAS AERUGINOSA (A)  Final      Susceptibility   Pseudomonas aeruginosa - MIC*    CEFTAZIDIME 4 SENSITIVE Sensitive     CIPROFLOXACIN 2 INTERMEDIATE Intermediate     GENTAMICIN <=1 SENSITIVE Sensitive     IMIPENEM 2 SENSITIVE Sensitive     PIP/TAZO 8 SENSITIVE Sensitive     CEFEPIME 4 SENSITIVE Sensitive     * >=100,000 COLONIES/mL PSEUDOMONAS AERUGINOSA    Coagulation Studies: No results for input(s): LABPROT, INR in the last 72 hours.  Urinalysis: No results for input(s): COLORURINE, LABSPEC, PHURINE, GLUCOSEU, HGBUR, BILIRUBINUR, KETONESUR, PROTEINUR, UROBILINOGEN, NITRITE, LEUKOCYTESUR in the last 72 hours.  Invalid input(s): APPERANCEUR    Imaging: No results found.   Medications:       Assessment/ Plan:  Pt is a  83 y.o. white male with a recent PMHx of acute renal failure on chronic kidney disease stage III requiring hemodialysis status post PermCath placement on June 02, 2017,  acute hypoxic respiratory failure status post tracheostomy placement May 29, 2017, dysphagia status post PEG, recurrent aspiration pneumonia, diabetes mellitus type 2, COPD, hypertension, who was admitted [facility]2017 for ongoing management.   1.  Acute renal failure previously requiring hemodialysis. 2.  Chronic kidney disease stage III baseline creatinine 1.3. 3.  Acute  respiratory failure status post tracheostomy placement. 4.  Recurrent aspiration pneumonia. 5.  Anemia of chronic kidney disease.  Plan: PermCath has been removed.  No new renal function testing available thus far and we recommend rechecking renal parameters today.  Good urine output of 1.4 L over the preceding 24 hours noted.  His tracheostomy also remains capped at the moment.  Hemoglobin also appears to be relatively stable at 11.5.  Otherwise plan as per primary team.   LOS: 0 Cotina Freedman 5/20/20198:42 AM

## 2017-06-29 NOTE — Progress Notes (Signed)
Pulmonary Critical Care Medicine [facility] [place]   PULMONARY SERVICE  PROGRESS NOTE  Date of Service: 06/29/2017  Mark Farrell  ONG:[id number]  DOB: [birth year]   DOA: 06/02/2017  Referring Physician: Carron Curie, MD  HPI: Mark Farrell is a 83 y.o. male seen for follow up of Acute on Chronic Respiratory Failure.  He is doing well has been on capping trials without distress.  Secretions are waxing and waning at this time and so basically patient been requiring suctioning for 2-3 times apparently during the shift.  Medications: Reviewed on Rounds  Physical Exam:  Vitals: Temperature 97.9 pulse 89 respiratory rate 36 blood pressure 160/94 saturations 98%  Ventilator Settings off the ventilator capping  . General: Comfortable at this time . Eyes: Grossly normal lids, irises & conjunctiva . ENT: grossly tongue is normal . Neck: no obvious mass . Cardiovascular: S1 S2 normal no gallop . Respiratory: No rhonchi expansion equal . Abdomen: soft . Skin: no rash seen on limited exam . Musculoskeletal: not rigid . Psychiatric:unable to assess . Neurologic: no seizure no involuntary movements         Labs on Admission:  Basic Metabolic Panel: Recent Labs  Lab 06/23/17 0540 06/27/17 0822 06/29/17 0649  NA 141 143 145  K 3.9 3.7 4.2  CL 98* 103 104  CO2 33* 27 26  GLUCOSE 144* 159* 134*  BUN 72* 78* 75*  CREATININE 2.26* 2.27* 2.14*  CALCIUM 9.0 9.3 9.5  MG 1.9 1.9 1.9  PHOS 3.9 3.9 3.9    Liver Function Tests: Recent Labs  Lab 06/23/17 0540 06/27/17 0822 06/29/17 0649  ALBUMIN 2.1* 2.4* 2.3*   No results for input(s): LIPASE, AMYLASE in the last 168 hours. No results for input(s): AMMONIA in the last 168 hours.  CBC: Recent Labs  Lab 06/23/17 0540 06/27/17 0822 06/29/17 0649  WBC 9.0 11.1* 9.7  HGB 11.3* 12.2* 11.5*  HCT 37.8* 41.0 38.7*  MCV 88.3 88.2 88.6  PLT 282 314 280    Cardiac Enzymes: No results for input(s): CKTOTAL,  CKMB, CKMBINDEX, TROPONINI in the last 168 hours.  BNP (last 3 results) No results for input(s): BNP in the last 8760 hours.  ProBNP (last 3 results) No results for input(s): PROBNP in the last 8760 hours.  Radiological Exams on Admission: Ir Removal Tun Cv Cath W/o Fl  Result Date: 06/26/2017 CLINICAL DATA:  History of acute on chronic renal failure, improved. Tunneled right IJ hemodialysis catheter, no longer needed. EXAM: TUNNELED HEMODIALYSIS CATHETER REMOVAL TECHNIQUE: Overlying skin prepped with chlorhexidine, draped in usual sterile fashion, infiltrated locally with 1% lidocaine. The previously placed tunneled right IJ hemodialysis catheter was dissected free from the underlying soft tissues and removed intact. Hemostasis was achieved. Site covered with a sterile dressing. The patient tolerated the procedure well. COMPLICATIONS: none IMPRESSION: 1. Technically successful tunneled hemodialysis catheter removal. Electronically Signed   By: Corlis Leak M.D.   On: 06/26/2017 14:53    Assessment/Plan Active Problems:   Acute on chronic respiratory failure with hypoxia (HCC)   Aspiration pneumonia of both lower lobes due to gastric secretions (HCC)   CKD (chronic kidney disease), stage III (HCC)   COPD with chronic bronchitis (HCC)   1.  Acute on chronic respiratory failure with hypoxia at this time patient is doing fairly well with capping we will continue with capping trials await discharge planning. 2.  Aspiration pneumonia treated with antibiotics we will continue to follow along 3.  Chronic kidney disease  followed by nephrology has had improving urine output 4.  COPD severe disease we will continue present management   I have personally seen and evaluated the patient, evaluated laboratory and imaging results, formulated the assessment and plan and placed orders. The Patient requires high complexity decision making for assessment and support.  Case was discussed on Rounds with the  Respiratory Therapy Staff  Yevonne Pax, MD [facility] Pulmonary Critical Care Medicine Sleep Medicine

## 2017-06-30 DIAGNOSIS — J690 Pneumonitis due to inhalation of food and vomit: Secondary | ICD-10-CM | POA: Diagnosis not present

## 2017-06-30 DIAGNOSIS — N183 Chronic kidney disease, stage 3 (moderate): Secondary | ICD-10-CM | POA: Diagnosis not present

## 2017-06-30 DIAGNOSIS — J449 Chronic obstructive pulmonary disease, unspecified: Secondary | ICD-10-CM | POA: Diagnosis not present

## 2017-06-30 DIAGNOSIS — J9621 Acute and chronic respiratory failure with hypoxia: Secondary | ICD-10-CM | POA: Diagnosis not present

## 2017-06-30 NOTE — Progress Notes (Signed)
Pulmonary Critical Care Medicine [facility] [place]   PULMONARY SERVICE  PROGRESS NOTE  Date of Service: 06/30/2017  Mark Farrell  ZOX:[id number]  DOB: 1934/10/12   DOA: 06/02/2017  Referring Physician: Carron Curie, MD  HPI: Mark Farrell is a 83 y.o. male seen for follow up of Acute on Chronic Respiratory Failure.  Doing well is capped on 3 L nasal cannula awaiting discharge planning  Medications: Reviewed on Rounds  Physical Exam:  Vitals: Temperature 97.5 pulse 98 respiratory rate 28 blood pressure 175/81 saturations 95%  Ventilator Settings not on the ventilator  . General: Comfortable at this time . Eyes: Grossly normal lids, irises & conjunctiva . ENT: grossly tongue is normal . Neck: no obvious mass . Cardiovascular: S1 S2 normal no gallop . Respiratory: No rhonchi expansion equal . Abdomen: soft . Skin: no rash seen on limited exam . Musculoskeletal: not rigid . Psychiatric:unable to assess . Neurologic: no seizure no involuntary movements         Labs on Admission:  Basic Metabolic Panel: Recent Labs  Lab 06/27/17 0822 06/29/17 0649  NA 143 145  K 3.7 4.2  CL 103 104  CO2 27 26  GLUCOSE 159* 134*  BUN 78* 75*  CREATININE 2.27* 2.14*  CALCIUM 9.3 9.5  MG 1.9 1.9  PHOS 3.9 3.9    Liver Function Tests: Recent Labs  Lab 06/27/17 0822 06/29/17 0649  ALBUMIN 2.4* 2.3*   No results for input(s): LIPASE, AMYLASE in the last 168 hours. No results for input(s): AMMONIA in the last 168 hours.  CBC: Recent Labs  Lab 06/27/17 0822 06/29/17 0649  WBC 11.1* 9.7  HGB 12.2* 11.5*  HCT 41.0 38.7*  MCV 88.2 88.6  PLT 314 280    Cardiac Enzymes: No results for input(s): CKTOTAL, CKMB, CKMBINDEX, TROPONINI in the last 168 hours.  BNP (last 3 results) No results for input(s): BNP in the last 8760 hours.  ProBNP (last 3 results) No results for input(s): PROBNP in the last 8760 hours.  Radiological Exams on Admission: Ir  Removal Tun Cv Cath W/o Fl  Result Date: 06/26/2017 CLINICAL DATA:  History of acute on chronic renal failure, improved. Tunneled right IJ hemodialysis catheter, no longer needed. EXAM: TUNNELED HEMODIALYSIS CATHETER REMOVAL TECHNIQUE: Overlying skin prepped with chlorhexidine, draped in usual sterile fashion, infiltrated locally with 1% lidocaine. The previously placed tunneled right IJ hemodialysis catheter was dissected free from the underlying soft tissues and removed intact. Hemostasis was achieved. Site covered with a sterile dressing. The patient tolerated the procedure well. COMPLICATIONS: none IMPRESSION: 1. Technically successful tunneled hemodialysis catheter removal. Electronically Signed   By: Corlis Leak M.D.   On: 06/26/2017 14:53    Assessment/Plan Active Problems:   Acute on chronic respiratory failure with hypoxia (HCC)   Aspiration pneumonia of both lower lobes due to gastric secretions (HCC)   CKD (chronic kidney disease), stage III (HCC)   COPD with chronic bronchitis (HCC)   1. Acute on chronic respiratory failure with hypoxia doing fine with capping we will continue with present supportive care 2. Pneumonia due to aspiration treated with antibiotics 3. Chronic kidney disease stable will follow 4. COPD at baseline we will continue present management   I have personally seen and evaluated the patient, evaluated laboratory and imaging results, formulated the assessment and plan and placed orders. The Patient requires high complexity decision making for assessment and support.  Case was discussed on Rounds with the Respiratory Therapy Staff  [facility]  Richardson Dopp, MD [facility] Pulmonary Critical Care Medicine Sleep Medicine

## 2017-07-01 ENCOUNTER — Other Ambulatory Visit (HOSPITAL_COMMUNITY): Payer: Medicaid Other

## 2017-07-01 DIAGNOSIS — J690 Pneumonitis due to inhalation of food and vomit: Secondary | ICD-10-CM | POA: Diagnosis not present

## 2017-07-01 DIAGNOSIS — J449 Chronic obstructive pulmonary disease, unspecified: Secondary | ICD-10-CM | POA: Diagnosis not present

## 2017-07-01 DIAGNOSIS — R4182 Altered mental status, unspecified: Secondary | ICD-10-CM

## 2017-07-01 DIAGNOSIS — J9621 Acute and chronic respiratory failure with hypoxia: Secondary | ICD-10-CM | POA: Diagnosis not present

## 2017-07-01 DIAGNOSIS — N183 Chronic kidney disease, stage 3 (moderate): Secondary | ICD-10-CM | POA: Diagnosis not present

## 2017-07-01 LAB — BLOOD GAS, ARTERIAL
ACID-BASE EXCESS: 2.9 mmol/L — AB (ref 0.0–2.0)
Bicarbonate: 26.5 mmol/L (ref 20.0–28.0)
FIO2: 21
O2 SAT: 95.1 %
PATIENT TEMPERATURE: 98.6
PH ART: 7.463 — AB (ref 7.350–7.450)
pCO2 arterial: 37.5 mmHg (ref 32.0–48.0)
pO2, Arterial: 68.6 mmHg — ABNORMAL LOW (ref 83.0–108.0)

## 2017-07-01 LAB — CBC
HCT: 41.6 % (ref 39.0–52.0)
Hemoglobin: 12.3 g/dL — ABNORMAL LOW (ref 13.0–17.0)
MCH: 25.8 pg — ABNORMAL LOW (ref 26.0–34.0)
MCHC: 29.6 g/dL — ABNORMAL LOW (ref 30.0–36.0)
MCV: 87.4 fL (ref 78.0–100.0)
PLATELETS: 299 10*3/uL (ref 150–400)
RBC: 4.76 MIL/uL (ref 4.22–5.81)
RDW: 15.4 % (ref 11.5–15.5)
WBC: 9.4 10*3/uL (ref 4.0–10.5)

## 2017-07-01 LAB — BASIC METABOLIC PANEL
Anion gap: 11 (ref 5–15)
BUN: 82 mg/dL — AB (ref 6–20)
CO2: 28 mmol/L (ref 22–32)
Calcium: 10 mg/dL (ref 8.9–10.3)
Chloride: 106 mmol/L (ref 101–111)
Creatinine, Ser: 2.11 mg/dL — ABNORMAL HIGH (ref 0.61–1.24)
GFR calc Af Amer: 32 mL/min — ABNORMAL LOW (ref >60)
GFR, EST NON AFRICAN AMERICAN: 28 mL/min — AB (ref >60)
Glucose, Bld: 158 mg/dL — ABNORMAL HIGH (ref 65–99)
POTASSIUM: 4 mmol/L (ref 3.5–5.1)
Sodium: 145 mmol/L (ref 135–145)

## 2017-07-01 LAB — MAGNESIUM: Magnesium: 2 mg/dL (ref 1.7–2.4)

## 2017-07-01 NOTE — Progress Notes (Signed)
Pulmonary Critical Care Medicine [facility] [place]   PULMONARY SERVICE  PROGRESS NOTE  Date of Service: 07/01/2017  Mark Farrell  QIO:[id number]  DOB: [birth year]   DOA: 06/02/2017  Referring Physician: Carron Curie, MD  HPI: Mark Farrell is a 83 y.o. male seen for follow up of Acute on Chronic Respiratory Failure.  Patient right now is doing fine with capping was a little bit more confused today.  The family was in the room and they noted that he was not his usual self.  Reviewed the labs and he was a little bit tachycardic also.  Could very well be dehydrated versus hypercapnic so we asked for a blood gas as well as getting a chest x-ray done today.  Medications: Reviewed on Rounds  Physical Exam:  Vitals: Temperature 97.2 pulse 91 respiratory rate 28 blood pressure 153/78 saturations 97%  Ventilator Settings off the ventilator on capping trials  . General: Comfortable at this time . Eyes: Grossly normal lids, irises & conjunctiva . ENT: grossly tongue is normal . Neck: no obvious mass . Cardiovascular: S1 S2 normal no gallop . Respiratory: Scattered rhonchi expansion is equal . Abdomen: soft . Skin: no rash seen on limited exam . Musculoskeletal: not rigid . Psychiatric:unable to assess . Neurologic: no seizure no involuntary movements         Labs on Admission:  Basic Metabolic Panel: Recent Labs  Lab 06/27/17 0822 06/29/17 0649 07/01/17 0701  NA 143 145 145  K 3.7 4.2 4.0  CL 103 104 106  CO2 GLUCOSE 159* 134* 158*  BUN 78* 75* 82*  CREATININE 2.27* 2.14* 2.11*  CALCIUM 9.3 9.5 10.0  MG 1.9 1.9 2.0  PHOS 3.9 3.9  --     Liver Function Tests: Recent Labs  Lab 06/27/17 0822 06/29/17 0649  ALBUMIN 2.4* 2.3*   No results for input(s): LIPASE, AMYLASE in the last 168 hours. No results for input(s): AMMONIA in the last 168 hours.  CBC: Recent Labs  Lab 06/27/17 0822 06/29/17 0649 07/01/17 0701  WBC 11.1* 9.7 9.4   HGB 12.2* 11.5* 12.3*  HCT 41.0 38.7* 41.6  MCV 88.2 88.6 87.4  PLT 314 280 299    Cardiac Enzymes: No results for input(s): CKTOTAL, CKMB, CKMBINDEX, TROPONINI in the last 168 hours.  BNP (last 3 results) No results for input(s): BNP in the last 8760 hours.  ProBNP (last 3 results) No results for input(s): PROBNP in the last 8760 hours.  Radiological Exams on Admission: No results found.  Assessment/Plan Active Problems:   Acute on chronic respiratory failure with hypoxia (HCC)   Aspiration pneumonia of both lower lobes due to gastric secretions (HCC)   CKD (chronic kidney disease), stage III (HCC)   COPD with chronic bronchitis (HCC)   1. Acute on chronic respiratory failure with hypoxia we will continue with supportive care he is now back on oxygen therapy.  Ordered an ABG to assess his PCO2 levels and PO2 levels.  Also ordered a chest x-ray. 2. Pneumonia due to aspiration will continue with supportive care follow-up x-ray ordered. 3. COPD severe disease we will continue to monitor 4. Chronic kidney disease stage III follow-up labs reviewed 5. Altered mental status this is a new finding we will have we will discuss with primary care and have ABG done as well as x-rays as already noted above in addition as noted he may be dehydrated so therefore would consider some fluid boluses.   I  have personally seen and evaluated the patient, evaluated laboratory and imaging results, formulated the assessment and plan and placed orders. The Patient requires high complexity decision making for assessment and support.  Case was discussed on Rounds with the Respiratory Therapy Staff time 35 minutes  Yevonne Pax, MD [facility] Pulmonary Critical Care Medicine Sleep Medicine

## 2017-07-01 NOTE — Progress Notes (Signed)
[facility]  ROUNDING NOTE   Subjective:  Family at bedside today. Some confusion reported. Creatinine stable at 2.1, BUN slightly higher today at 82.    Objective:  Vital signs in last 24 hours:  Temperature 97.9 pulse 89 respirations 36 blood pressure 163/94  Physical Exam: General: Chronically ill appearing  Head: Normocephalic, atraumatic. Moist oral mucosal membranes  Eyes: Anicteric  Neck: Tracheostomy in place, normal effort  Lungs:  Rhonchi bilateral, normal effort  Heart: S1S2 no rubs  Abdomen:  Soft, nontender, +PEG  Extremities: no peripheral edema.  Neurologic:  Awake, follows simple commands.  Skin: No lesions  Access: RIJ permcath removed.    Basic Metabolic Panel: Recent Labs  Lab 06/27/17 0822 06/29/17 0649 07/01/17 0701  NA 143 145 145  K 3.7 4.2 4.0  CL 103 104 106  CO2 GLUCOSE 159* 134* 158*  BUN 78* 75* 82*  CREATININE 2.27* 2.14* 2.11*  CALCIUM 9.3 9.5 10.0  MG 1.9 1.9 2.0  PHOS 3.9 3.9  --     Liver Function Tests: Recent Labs  Lab 06/27/17 0822 06/29/17 0649  ALBUMIN 2.4* 2.3*   No results for input(s): LIPASE, AMYLASE in the last 168 hours. No results for input(s): AMMONIA in the last 168 hours.  CBC: Recent Labs  Lab 06/27/17 0822 06/29/17 0649 07/01/17 0701  WBC 11.1* 9.7 9.4  HGB 12.2* 11.5* 12.3*  HCT 41.0 38.7* 41.6  MCV 88.2 88.6 87.4  PLT 314 280 299    Cardiac Enzymes: No results for input(s): CKTOTAL, CKMB, CKMBINDEX, TROPONINI in the last 168 hours.  BNP: Invalid input(s): POCBNP  CBG: No results for input(s): GLUCAP in the last 168 hours.  Microbiology: Results for orders placed or performed during the hospital encounter of 06/02/17  C difficile quick scan w PCR reflex     Status: Abnormal   Collection Time: 06/03/17  6:25 AM  Result Value Ref Range Status   C Diff antigen POSITIVE (A) NEGATIVE Final   C Diff toxin NEGATIVE NEGATIVE Final   C Diff interpretation Results are  indeterminate. See PCR results.  Final    Comment: Performed at [facility], [address], [place], [place] [address]  Culture, Urine     Status: None   Collection Time: 06/03/17  6:25 AM  Result Value Ref Range Status   Specimen Description URINE, RANDOM  Final   Special Requests NONE  Final   Culture   Final    NO GROWTH Performed at [facility], [address] [place], [place] [address]    Report Status 06/04/2017 FINAL  Final  C. Diff by PCR, Reflexed     Status: None   Collection Time: 06/03/17  8:09 AM  Result Value Ref Range Status   Toxigenic C. Difficile by PCR NEGATIVE NEGATIVE Final    Comment: Patient is colonized with non toxigenic C. difficile. May not need treatment unless significant symptoms are present. Performed at [facility], [address] [place], [place] [address]   Culture, respiratory (NON-Expectorated)     Status: None   Collection Time: 06/04/17 10:15 AM  Result Value Ref Range Status   Specimen Description TRACHEAL ASPIRATE  Final   Special Requests NONE  Final   Gram Stain   Final    RARE WBC PRESENT, PREDOMINANTLY MONONUCLEAR RARE SQUAMOUS EPITHELIAL CELLS PRESENT NO ORGANISMS SEEN    Culture   Final    Consistent with normal respiratory  flora. Performed at [facility], [address] [place], [place] [address]    Report Status 06/06/2017 FINAL  Final  C difficile quick scan w PCR reflex     Status: Abnormal   Collection Time: 06/09/17  2:26 AM  Result Value Ref Range Status   C Diff antigen POSITIVE (A) NEGATIVE Final   C Diff toxin NEGATIVE NEGATIVE Final   C Diff interpretation Results are indeterminate. See PCR results.  Final    Comment: Performed at [facility], [address], [place], [place] [address]  C. Diff by PCR, Reflexed     Status: None   Collection Time: 06/09/17  2:26 AM  Result Value Ref Range Status   Toxigenic C. Difficile by PCR NEGATIVE NEGATIVE Final    Comment: Patient is  colonized with non toxigenic C. difficile. May not need treatment unless significant symptoms are present. Performed at [facility], [address], [place], [place] [address]   Culture, respiratory (NON-Expectorated)     Status: None   Collection Time: 06/22/17 12:44 PM  Result Value Ref Range Status   Specimen Description TRACHEAL ASPIRATE  Final   Special Requests NONE  Final   Gram Stain   Final    FEW WBC PRESENT, PREDOMINANTLY PMN RARE SQUAMOUS EPITHELIAL CELLS PRESENT RARE GRAM NEGATIVE RODS RARE GRAM POSITIVE RODS RARE GRAM POSITIVE COCCI IN PAIRS IN SINGLES    Culture   Final    Consistent with normal respiratory flora. Performed at [facility], [address] [place], [place] [address]    Report Status 06/24/2017 FINAL  Final  Culture, Urine     Status: Abnormal   Collection Time: 06/22/17  3:50 PM  Result Value Ref Range Status   Specimen Description URINE, RANDOM  Final   Special Requests   Final    NONE Performed at [facility], [address] [place], [place] [address]    Culture >=100,000 COLONIES/mL PSEUDOMONAS AERUGINOSA (A)  Final   Report Status 06/24/2017 FINAL  Final   Organism ID, Bacteria PSEUDOMONAS AERUGINOSA (A)  Final      Susceptibility   Pseudomonas aeruginosa - MIC*    CEFTAZIDIME 4 SENSITIVE Sensitive     CIPROFLOXACIN 2 INTERMEDIATE Intermediate     GENTAMICIN <=1 SENSITIVE Sensitive     IMIPENEM 2 SENSITIVE Sensitive     PIP/TAZO 8 SENSITIVE Sensitive     CEFEPIME 4 SENSITIVE Sensitive     * >=100,000 COLONIES/mL PSEUDOMONAS AERUGINOSA    Coagulation Studies: No results for input(s): LABPROT, INR in the last 72 hours.  Urinalysis: No results for input(s): COLORURINE, LABSPEC, PHURINE, GLUCOSEU, HGBUR, BILIRUBINUR, KETONESUR, PROTEINUR, UROBILINOGEN, NITRITE, LEUKOCYTESUR in the last 72 hours.  Invalid input(s): APPERANCEUR    Imaging: No results found.   Medications:       Assessment/ Plan:  Pt is a  83 y.o. white male with a recent PMHx of acute renal failure on chronic kidney disease stage III requiring hemodialysis status post PermCath placement on June 02, 2017,  acute hypoxic respiratory failure status post tracheostomy placement May 29, 2017, dysphagia status post PEG, recurrent aspiration pneumonia, diabetes mellitus type 2, COPD, hypertension, who was admitted to [facility] on 06/02/2017 for ongoing management.   1.  Acute renal failure previously requiring hemodialysis. 2.  Chronic kidney disease stage III baseline creatinine 1.3. 3.  Acute respiratory failure status post tracheostomy placement. 4.  Recurrent aspiration pneumonia. 5.  Anemia of  chronic kidney disease.  Plan: PermCath has been removed.  No new renal function testing available thus far and we recommend rechecking renal parameters today.  Good urine output of 1.4 L over the preceding 24 hours noted.  His tracheostomy also remains capped at the moment.  Hemoglobin also appears to be relatively stable at 11.5.  Otherwise plan as per primary team.  Will d/c prostat given worsening azotemia.    LOS: 0 Landri Dorsainvil 5/22/20194:06 PM

## 2017-07-02 DIAGNOSIS — J449 Chronic obstructive pulmonary disease, unspecified: Secondary | ICD-10-CM | POA: Diagnosis not present

## 2017-07-02 DIAGNOSIS — N183 Chronic kidney disease, stage 3 (moderate): Secondary | ICD-10-CM | POA: Diagnosis not present

## 2017-07-02 DIAGNOSIS — J690 Pneumonitis due to inhalation of food and vomit: Secondary | ICD-10-CM | POA: Diagnosis not present

## 2017-07-02 DIAGNOSIS — J9621 Acute and chronic respiratory failure with hypoxia: Secondary | ICD-10-CM | POA: Diagnosis not present

## 2017-07-02 LAB — BLOOD GAS, ARTERIAL
Acid-Base Excess: 1.6 mmol/L (ref 0.0–2.0)
BICARBONATE: 25.7 mmol/L (ref 20.0–28.0)
O2 Content: 2 L/min
O2 Saturation: 98 %
PATIENT TEMPERATURE: 98.6
PH ART: 7.419 (ref 7.350–7.450)
pCO2 arterial: 40.4 mmHg (ref 32.0–48.0)
pO2, Arterial: 100 mmHg (ref 83.0–108.0)

## 2017-07-02 NOTE — Progress Notes (Signed)
Pulmonary Critical Care Medicine [facility]   PULMONARY SERVICE  PROGRESS NOTE  Date of Service: 07/02/2017  MAKOA SATZ  BMW:[id number]  DOB: June 04, 1934   DOA: 06/02/2017  Referring Physician: Carron Curie, MD  HPI: KATHLEEN LIKINS is a 83 y.o. male seen for follow up of Acute on Chronic Respiratory Failure.  Capping right now doing fairly well patients on room air  Medications: Reviewed on Rounds  Physical Exam:  Vitals: Temperature 97.2 pulse 106 respiratory rate 15 blood pressure 154/88 saturations 97%  Ventilator Settings capping will be continued  . General: Comfortable at this time . Eyes: Grossly normal lids, irises & conjunctiva . ENT: grossly tongue is normal . Neck: no obvious mass . Cardiovascular: S1 S2 normal no gallop . Respiratory: No rhonchi expansion is equal . Abdomen: soft . Skin: no rash seen on limited exam . Musculoskeletal: not rigid . Psychiatric:unable to assess . Neurologic: no seizure no involuntary movements         Labs on Admission:  Basic Metabolic Panel: Recent Labs  Lab 06/27/17 0822 06/29/17 0649 07/01/17 0701  NA 143 145 145  K 3.7 4.2 4.0  CL 103 104 106  CO2 GLUCOSE 159* 134* 158*  BUN 78* 75* 82*  CREATININE 2.27* 2.14* 2.11*  CALCIUM 9.3 9.5 10.0  MG 1.9 1.9 2.0  PHOS 3.9 3.9  --     Liver Function Tests: Recent Labs  Lab 06/27/17 0822 06/29/17 0649  ALBUMIN 2.4* 2.3*   No results for input(s): LIPASE, AMYLASE in the last 168 hours. No results for input(s): AMMONIA in the last 168 hours.  CBC: Recent Labs  Lab 06/27/17 0822 06/29/17 0649 07/01/17 0701  WBC 11.1* 9.7 9.4  HGB 12.2* 11.5* 12.3*  HCT 41.0 38.7* 41.6  MCV 88.2 88.6 87.4  PLT 314 280 299    Cardiac Enzymes: No results for input(s): CKTOTAL, CKMB, CKMBINDEX, TROPONINI in the last 168 hours.  BNP (last 3 results) No results for input(s): BNP in the last 8760 hours.  ProBNP (last 3 results) No  results for input(s): PROBNP in the last 8760 hours.  Radiological Exams on Admission: Dg Chest Port 1 View  Result Date: 07/01/2017 CLINICAL DATA:  Respiratory failure EXAM: PORTABLE CHEST 1 VIEW COMPARISON:  06/08/2017 FINDINGS: Tracheostomy is unchanged. Interval removal of right dialysis catheter. Heart is normal size. Patchy left infrahilar airspace opacity. Right lung clear. No effusion or acute bony abnormality. IMPRESSION: Patchy left infrahilar airspace opacity, question pneumonia. Electronically Signed   By: Charlett Nose M.D.   On: 07/01/2017 20:42    Assessment/Plan Active Problems:   Acute on chronic respiratory failure with hypoxia (HCC)   Aspiration pneumonia of both lower lobes due to gastric secretions (HCC)   CKD (chronic kidney disease), stage III (HCC)   COPD with chronic bronchitis (HCC)   1. Acute on chronic respiratory failure with hypoxia we will continue with capping decannulation planned at this time. 2. Pneumonia due to aspiration continue with pulmonary toilet supportive care 3. Chronic kidney disease stage III at baseline we will follow 4. COPD advanced disease we will monitor   I have personally seen and evaluated the patient, evaluated laboratory and imaging results, formulated the assessment and plan and placed orders. The Patient requires high complexity decision making for assessment and support.  Case was discussed on Rounds with the Respiratory Therapy Staff  Yevonne Pax, MD [facility] Pulmonary Critical Care Medicine Sleep Medicine

## 2017-07-03 ENCOUNTER — Other Ambulatory Visit (HOSPITAL_COMMUNITY): Payer: Medicaid Other

## 2017-07-03 LAB — HEPATIC FUNCTION PANEL
ALT: 17 U/L (ref 17–63)
AST: 20 U/L (ref 15–41)
Albumin: 2.3 g/dL — ABNORMAL LOW (ref 3.5–5.0)
Alkaline Phosphatase: 108 U/L (ref 38–126)
Bilirubin, Direct: <0.1 mg/dL — ABNORMAL LOW (ref 0.1–0.5)
TOTAL PROTEIN: 7.7 g/dL (ref 6.5–8.1)
Total Bilirubin: 0.4 mg/dL (ref 0.3–1.2)

## 2017-07-03 LAB — CBC
HEMATOCRIT: 38.4 % — AB (ref 39.0–52.0)
HEMOGLOBIN: 11.1 g/dL — AB (ref 13.0–17.0)
MCH: 25.6 pg — ABNORMAL LOW (ref 26.0–34.0)
MCHC: 28.9 g/dL — ABNORMAL LOW (ref 30.0–36.0)
MCV: 88.7 fL (ref 78.0–100.0)
Platelets: 274 10*3/uL (ref 150–400)
RBC: 4.33 MIL/uL (ref 4.22–5.81)
RDW: 15.6 % — ABNORMAL HIGH (ref 11.5–15.5)
WBC: 14.6 10*3/uL — AB (ref 4.0–10.5)

## 2017-07-03 LAB — RENAL FUNCTION PANEL
ANION GAP: 9 (ref 5–15)
Albumin: 2.3 g/dL — ABNORMAL LOW (ref 3.5–5.0)
BUN: 82 mg/dL — ABNORMAL HIGH (ref 6–20)
CHLORIDE: 112 mmol/L — AB (ref 101–111)
CO2: 24 mmol/L (ref 22–32)
Calcium: 9.1 mg/dL (ref 8.9–10.3)
Creatinine, Ser: 2.08 mg/dL — ABNORMAL HIGH (ref 0.61–1.24)
GFR, EST AFRICAN AMERICAN: 33 mL/min — AB (ref >60)
GFR, EST NON AFRICAN AMERICAN: 28 mL/min — AB (ref >60)
Glucose, Bld: 188 mg/dL — ABNORMAL HIGH (ref 65–99)
POTASSIUM: 3.6 mmol/L (ref 3.5–5.1)
Phosphorus: 3.3 mg/dL (ref 2.5–4.6)
Sodium: 145 mmol/L (ref 135–145)

## 2017-07-03 LAB — URINALYSIS, ROUTINE W REFLEX MICROSCOPIC
Bilirubin Urine: NEGATIVE
Glucose, UA: 150 mg/dL — AB
KETONES UR: NEGATIVE mg/dL
LEUKOCYTES UA: NEGATIVE
Nitrite: NEGATIVE
PH: 7 (ref 5.0–8.0)
PROTEIN: 30 mg/dL — AB
Specific Gravity, Urine: 1.014 (ref 1.005–1.030)

## 2017-07-03 LAB — VANCOMYCIN, TROUGH: Vancomycin Tr: 13 ug/mL — ABNORMAL LOW (ref 15–20)

## 2017-07-03 LAB — LACTIC ACID, PLASMA: Lactic Acid, Venous: 0.9 mmol/L (ref 0.5–1.9)

## 2017-07-03 LAB — AMMONIA: AMMONIA: 29 umol/L (ref 9–35)

## 2017-07-03 LAB — PREALBUMIN: Prealbumin: 22.2 mg/dL (ref 18–38)

## 2017-07-03 NOTE — Progress Notes (Signed)
[facility]  ROUNDING NOTE   Subjective:  Patient with worsening respiratory status this a.m. He appears to be aspirating gastric contents. However oxygen saturation good at 99%. He has been receiving suctioning over the night.    Objective:  Vital signs in last 24 hours:  Temperature 97.4 pulse 100 respirations 23 blood pressure 146/86  Physical Exam: General: Chronically ill appearing  Head: Normocephalic, atraumatic. Moist oral mucosal membranes  Eyes: Anicteric  Neck: Tracheostomy in place, normal effort  Lungs:  Coarse rhonchi and rales bilateral  Heart: S1S2 no rubs  Abdomen:  Soft, nontender, +PEG  Extremities: no peripheral edema.  Neurologic: Awake, moving extremeties  Skin: No lesions  Access: RIJ permcath removed.    Basic Metabolic Panel: Recent Labs  Lab 06/27/17 0822 06/29/17 0649 07/01/17 0701  NA 143 145 145  K 3.7 4.2 4.0  CL 103 104 106  CO2 GLUCOSE 159* 134* 158*  BUN 78* 75* 82*  CREATININE 2.27* 2.14* 2.11*  CALCIUM 9.3 9.5 10.0  MG 1.9 1.9 2.0  PHOS 3.9 3.9  --     Liver Function Tests: Recent Labs  Lab 06/27/17 0822 06/29/17 0649  ALBUMIN 2.4* 2.3*   No results for input(s): LIPASE, AMYLASE in the last 168 hours. No results for input(s): AMMONIA in the last 168 hours.  CBC: Recent Labs  Lab 06/27/17 0822 06/29/17 0649 07/01/17 0701  WBC 11.1* 9.7 9.4  HGB 12.2* 11.5* 12.3*  HCT 41.0 38.7* 41.6  MCV 88.2 88.6 87.4  PLT 314 280 299    Cardiac Enzymes: No results for input(s): CKTOTAL, CKMB, CKMBINDEX, TROPONINI in the last 168 hours.  BNP: Invalid input(s): POCBNP  CBG: No results for input(s): GLUCAP in the last 168 hours.  Microbiology: Results for orders placed or performed during the hospital encounter of 06/02/17  C difficile quick scan w PCR reflex     Status: Abnormal   Collection Time: 06/03/17  6:25 AM  Result Value Ref Range Status   C Diff antigen POSITIVE (A) NEGATIVE Final    C Diff toxin NEGATIVE NEGATIVE Final   C Diff interpretation Results are indeterminate. See PCR results.  Final    Comment: Performed at [facility], [address] [place], [place] [address]  Culture, Urine     Status: None   Collection Time: 06/03/17  6:25 AM  Result Value Ref Range Status   Specimen Description URINE, RANDOM  Final   Special Requests NONE  Final   Culture   Final    NO GROWTH Performed at [facility], [address], [place], [place] [address]    Report Status 06/04/2017 FINAL  Final  C. Diff by PCR, Reflexed     Status: None   Collection Time: 06/03/17  8:09 AM  Result Value Ref Range Status   Toxigenic C. Difficile by PCR NEGATIVE NEGATIVE Final    Comment: Patient is colonized with non toxigenic C. difficile. May not need treatment unless significant symptoms are present. Performed at [facility], [address] [place], [place] [address]   Culture, respiratory (NON-Expectorated)     Status: None   Collection Time: 06/04/17 10:15 AM  Result Value Ref Range Status   Specimen Description TRACHEAL ASPIRATE  Final   Special Requests NONE  Final   Gram Stain   Final    RARE WBC PRESENT, PREDOMINANTLY MONONUCLEAR RARE SQUAMOUS EPITHELIAL CELLS PRESENT NO ORGANISMS SEEN    Culture  Final    Consistent with normal respiratory flora. Performed at [facility], [address], [place], [place] [address]    Report Status 06/06/2017 FINAL  Final  C difficile quick scan w PCR reflex     Status: Abnormal   Collection Time: 06/09/17  2:26 AM  Result Value Ref Range Status   C Diff antigen POSITIVE (A) NEGATIVE Final   C Diff toxin NEGATIVE NEGATIVE Final   C Diff interpretation Results are indeterminate. See PCR results.  Final    Comment: Performed at [facility], [address] [place], [place] [address]  C. Diff by PCR, Reflexed     Status: None   Collection Time: 06/09/17  2:26 AM  Result Value Ref Range Status   Toxigenic  C. Difficile by PCR NEGATIVE NEGATIVE Final    Comment: Patient is colonized with non toxigenic C. difficile. May not need treatment unless significant symptoms are present. Performed at [facility], [address], [place], [place] [address]   Culture, respiratory (NON-Expectorated)     Status: None   Collection Time: 06/22/17 12:44 PM  Result Value Ref Range Status   Specimen Description TRACHEAL ASPIRATE  Final   Special Requests NONE  Final   Gram Stain   Final    FEW WBC PRESENT, PREDOMINANTLY PMN RARE SQUAMOUS EPITHELIAL CELLS PRESENT RARE GRAM NEGATIVE RODS RARE GRAM POSITIVE RODS RARE GRAM POSITIVE COCCI IN PAIRS IN SINGLES    Culture   Final    Consistent with normal respiratory flora. Performed at [facility], [address] [place], [place] [address]    Report Status 06/24/2017 FINAL  Final  Culture, Urine     Status: Abnormal   Collection Time: 06/22/17  3:50 PM  Result Value Ref Range Status   Specimen Description URINE, RANDOM  Final   Special Requests   Final    NONE Performed at  [facility], [address] [place], [place] [address]    Culture >=100,000 COLONIES/mL PSEUDOMONAS AERUGINOSA (A)  Final   Report Status 06/24/2017 FINAL  Final   Organism ID, Bacteria PSEUDOMONAS AERUGINOSA (A)  Final      Susceptibility   Pseudomonas aeruginosa - MIC*    CEFTAZIDIME 4 SENSITIVE Sensitive     CIPROFLOXACIN 2 INTERMEDIATE Intermediate     GENTAMICIN <=1 SENSITIVE Sensitive     IMIPENEM 2 SENSITIVE Sensitive     PIP/TAZO 8 SENSITIVE Sensitive     CEFEPIME 4 SENSITIVE Sensitive     * >=100,000 COLONIES/mL PSEUDOMONAS AERUGINOSA    Coagulation Studies: No results for input(s): LABPROT, INR in the last 72 hours.  Urinalysis: Recent Labs    07/03/17 0645  COLORURINE YELLOW  LABSPEC 1.014  PHURINE 7.0  GLUCOSEU 150*  HGBUR MODERATE*  BILIRUBINUR NEGATIVE  KETONESUR NEGATIVE  PROTEINUR 30*  NITRITE NEGATIVE  LEUKOCYTESUR NEGATIVE       Imaging: Ct Head Wo Contrast  Result Date: 07/03/2017 CLINICAL DATA:  Altered mental status. History of aspiration pneumonia. EXAM: CT HEAD WITHOUT CONTRAST TECHNIQUE: Contiguous axial images were obtained from the base of the skull through the vertex without intravenous contrast. COMPARISON:  MRI of the head May 05, 2017 FINDINGS: BRAIN: No intraparenchymal hemorrhage, mass effect, midline shift or acute large vascular territory infarcts. Numerous old bilateral cerebellar infarcts. LEFT mesial temporooccipital lobe encephalomalacia. Ex vacuo dilatation LEFT lateral ventricle. No hydrocephalus. Old bilateral thalamus and LEFT basal ganglia infarcts. Patchy to confluent supratentorial white matter  hypodensities. No abnormal extra-axial fluid collections. Basal cisterns are patent. VASCULAR: Moderate calcific atherosclerosis of the carotid siphons. SKULL: No skull fracture. No significant scalp soft tissue swelling. SINUSES/ORBITS: Small RIGHT mastoid effusion. LEFT mastoid air cells are well aerated. RIGHT maxillary mucosal retention cysts. The included ocular globes and orbital contents are non-suspicious. OTHER: Tiny foreign body RIGHT preseptal soft tissues may be metallic. IMPRESSION: 1. No acute intracranial process. 2. 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 Old basal ganglia and thalami infarcts. Old large LEFT PCA territory infarct. 3. Moderate to severe chronic small vessel ischemic changes. 4. Tiny RIGHT preseptal radiopaque foreign body, potentially metallic which would be a contraindication MRI. Electronically Signed   By: Awilda Metro M.D.   On: 07/03/2017 00:58   Dg Chest Port 1 View  Result Date: 07/01/2017 CLINICAL DATA:  Respiratory failure EXAM: PORTABLE CHEST 1 VIEW COMPARISON:  06/08/2017 FINDINGS: Tracheostomy is unchanged. Interval removal of right dialysis catheter. Heart is normal size. Patchy left infrahilar airspace opacity. Right lung clear. No effusion or acute bony  abnormality. IMPRESSION: Patchy left infrahilar airspace opacity, question pneumonia. Electronically Signed   By: Charlett Nose M.D.   On: 07/01/2017 20:42     Medications:       Assessment/ Plan:  Pt is a 83 y.o. white male with a recent PMHx of acute renal failure on chronic kidney disease stage III requiring hemodialysis status post PermCath placement on June 02, 2017,  acute hypoxic respiratory failure status post tracheostomy placement May 29, 2017, dysphagia status post PEG, recurrent aspiration pneumonia, diabetes mellitus type 2, COPD, hypertension, who was admitted to [facility] on 06/02/2017 for ongoing management.   1.  Acute renal failure previously requiring hemodialysis. 2.  Chronic kidney disease stage III baseline creatinine 1.3. 3.  Acute respiratory failure status post tracheostomy placement. 4.  Recurrent aspiration pneumonia. 5.  Anemia of chronic kidney disease.  Plan: Patient has worsening respiratory status this a.m.  He has been receiving suctioning through the night.  It appears that he is aspirating gastric contents now.  Renal function to be repeated this a.m.  He is already on broad-spectrum antibiotic therapy for the aspiration pneumonia.  Previously was found that has significantly improved and most recent was 12.3 but this should be also rechecked.  Otherwise patient continues to have a guarded prognosis.   LOS: 0 Avigayil Ton 5/24/20199:10 AM

## 2017-07-04 LAB — URINE CULTURE: Culture: NO GROWTH

## 2017-07-05 LAB — CULTURE, RESPIRATORY W GRAM STAIN: Culture: NORMAL

## 2017-07-05 LAB — CULTURE, RESPIRATORY

## 2017-07-08 MED ORDER — AMLODIPINE BESYLATE 10 MG PO TABS
10.00 | ORAL_TABLET | ORAL | Status: DC
Start: 2017-07-09 — End: 2017-07-08

## 2017-07-08 MED ORDER — DEXTROSE 50 % IV SOLN
12.50 | INTRAVENOUS | Status: DC
Start: ? — End: 2017-07-08

## 2017-07-08 MED ORDER — ALBUTEROL SULFATE (2.5 MG/3ML) 0.083% IN NEBU
2.50 | INHALATION_SOLUTION | RESPIRATORY_TRACT | Status: DC
Start: 2017-07-08 — End: 2017-07-08

## 2017-07-08 MED ORDER — INSULIN REGULAR HUMAN 100 UNIT/ML IJ SOLN
0.00 | INTRAMUSCULAR | Status: DC
Start: 2017-07-08 — End: 2017-07-08

## 2017-07-08 MED ORDER — PREDNISONE 10 MG PO TABS
10.00 | ORAL_TABLET | ORAL | Status: DC
Start: 2017-07-09 — End: 2017-07-08

## 2017-07-08 MED ORDER — LIDOCAINE HCL 1 % IJ SOLN
0.50 | INTRAMUSCULAR | Status: DC
Start: ? — End: 2017-07-08

## 2017-07-08 MED ORDER — DEXTROSE 10 % IV SOLN
50.00 | INTRAVENOUS | Status: DC
Start: ? — End: 2017-07-08

## 2017-07-08 MED ORDER — IPRATROPIUM BROMIDE 0.02 % IN SOLN
0.50 | RESPIRATORY_TRACT | Status: DC
Start: 2017-07-08 — End: 2017-07-08

## 2017-07-08 MED ORDER — HEPARIN SODIUM (PORCINE) 10000 UNIT/ML IJ SOLN
5000.00 | INTRAMUSCULAR | Status: DC
Start: 2017-07-09 — End: 2017-07-08

## 2017-07-08 MED ORDER — ASPIRIN 81 MG PO CHEW
81.00 | CHEWABLE_TABLET | ORAL | Status: DC
Start: 2017-07-09 — End: 2017-07-08

## 2017-07-08 MED ORDER — SENNOSIDES-DOCUSATE SODIUM 8.6-50 MG PO TABS
2.00 | ORAL_TABLET | ORAL | Status: DC
Start: 2017-07-08 — End: 2017-07-08

## 2017-07-08 MED ORDER — GENERIC EXTERNAL MEDICATION
500.00 | Status: DC
Start: 2017-07-09 — End: 2017-07-08

## 2017-07-08 MED ORDER — GENERIC EXTERNAL MEDICATION
1.00 | Status: DC
Start: ? — End: 2017-07-08

## 2019-06-13 IMAGING — CR DG CHEST 1V PORT
1 series · 1 of 1 positions shown · non-contrast
Comparison: 05/30/2017

CLINICAL DATA: Respiratory failure.

EXAM:
PORTABLE CHEST 1 VIEW

[AP]
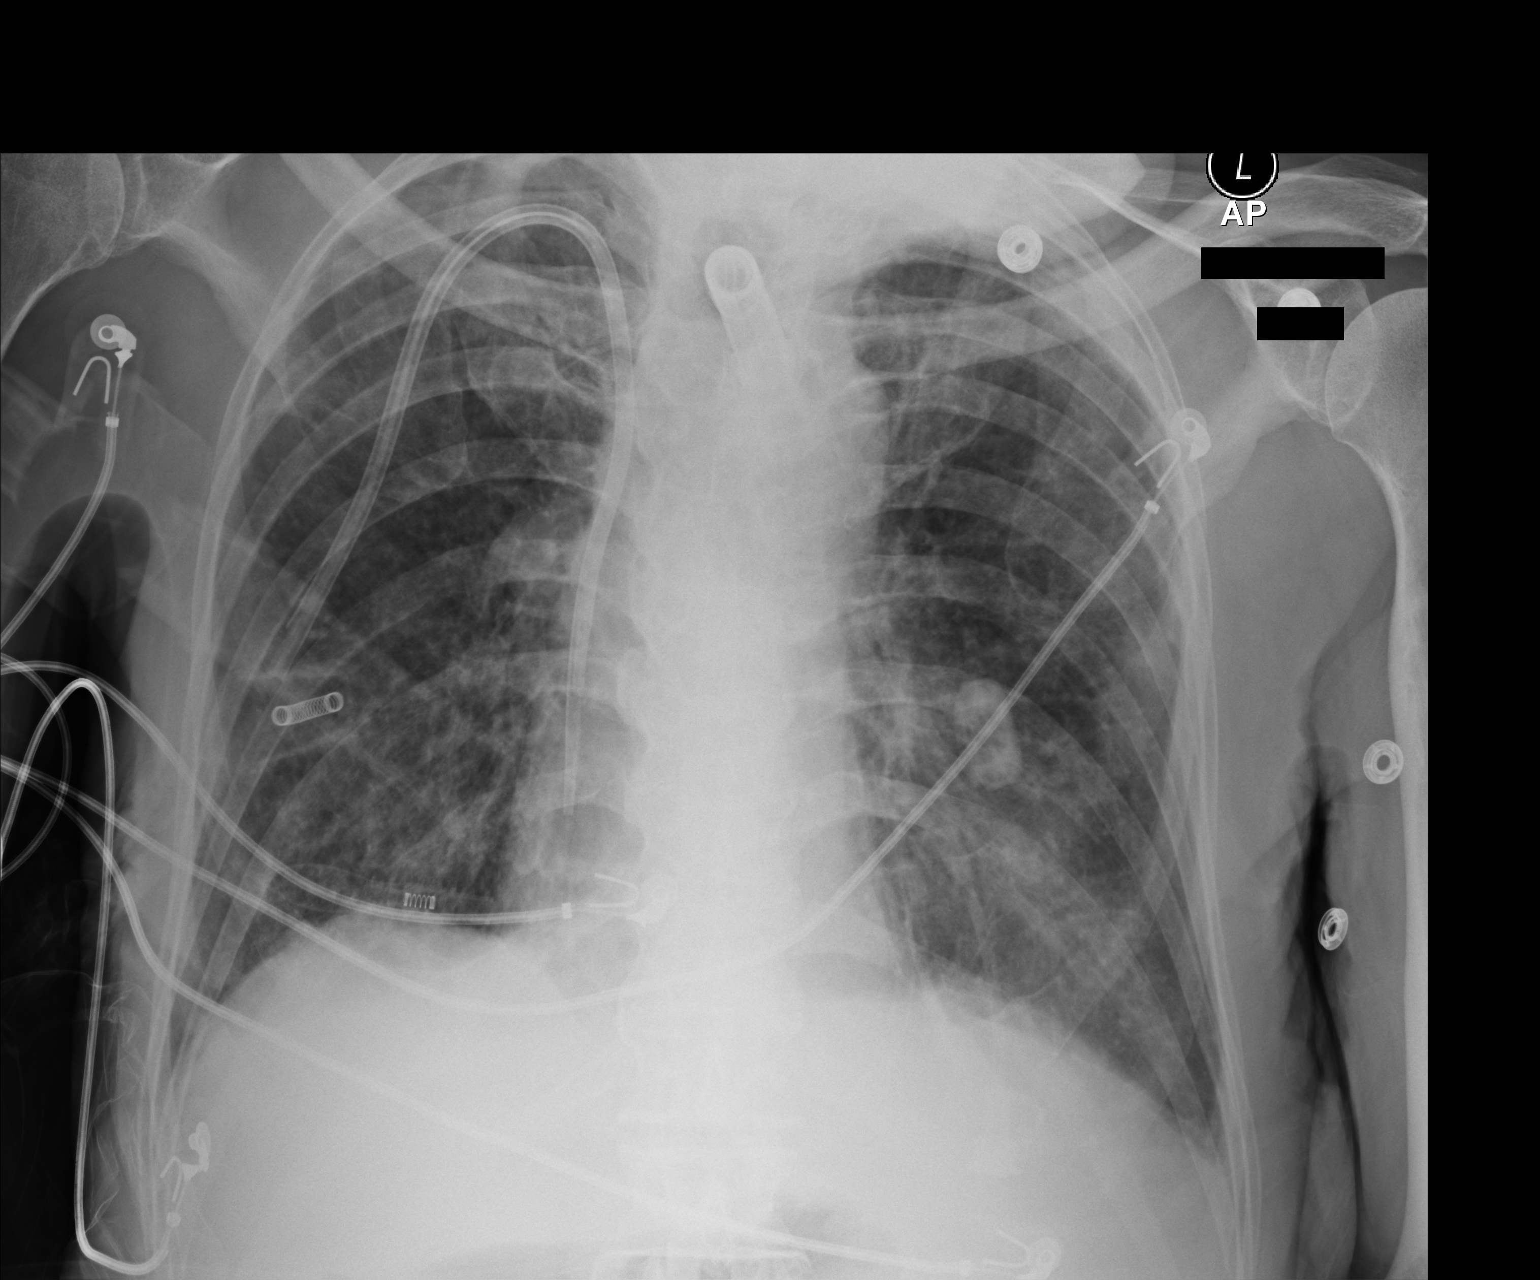

[1 of 1 positions shown; findings below may reference images not displayed]

FINDINGS: Tracheostomy tube overlies the airway. Pre-existing right jugular
catheter has been removed. A tunneled right jugular dialysis
catheter has been placed and terminates over the high right atrium.
Cardiomediastinal silhouette is within normal limits. Mild
interstitial densities remain throughout both lungs. Residual patchy
right basilar airspace opacities on the prior study have resolved.
No sizable pleural effusion or pneumothorax is identified.
IMPRESSION: 1. Resolution of residual right basilar airspace opacities.
2. Suspected mild residual interstitial edema.
3. Interval tunneled right jugular dialysis catheter placement.

## 2019-06-18 IMAGING — DX DG CHEST 1V PORT
1 series · 1 of 1 positions shown · non-contrast
Comparison: Radiograph June 03, 2017.

CLINICAL DATA: Acute respiratory failure.

EXAM:
PORTABLE CHEST 1 VIEW

[chest ap]
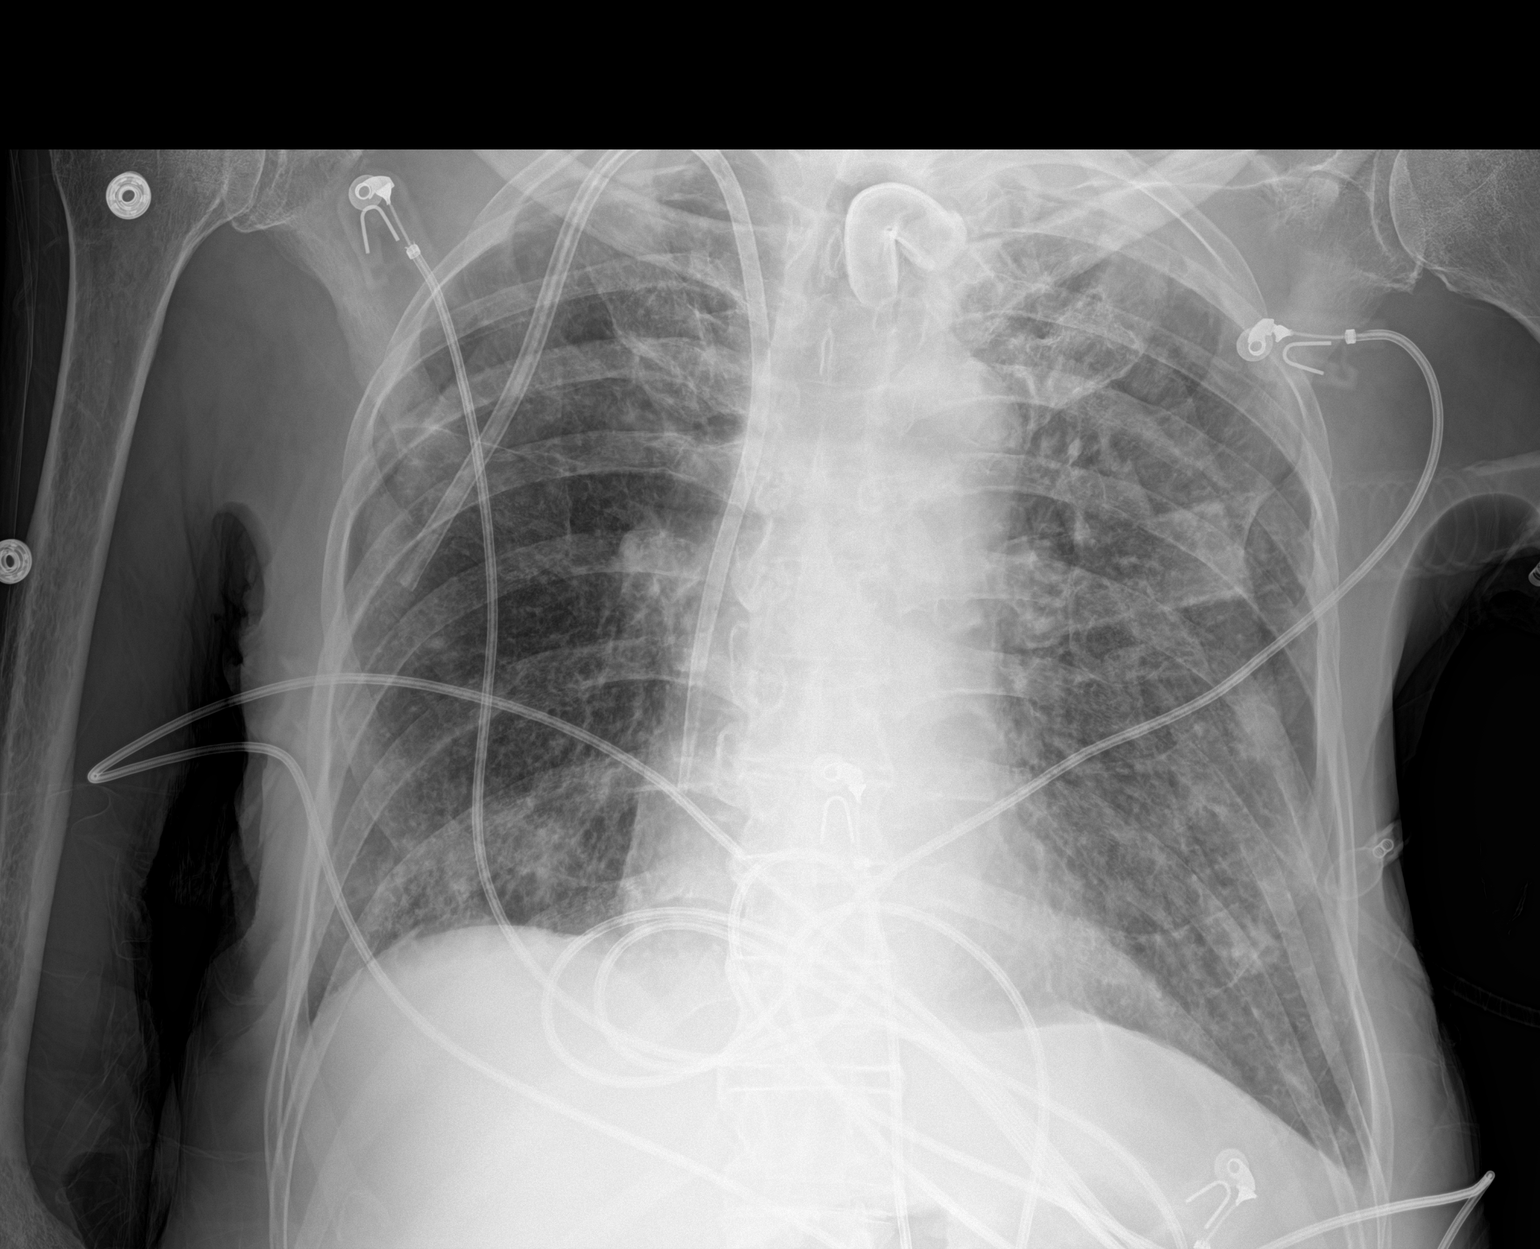

[1 of 1 positions shown; findings below may reference images not displayed]

FINDINGS: The heart size and mediastinal contours are within normal limits.
Tracheostomy tube is in good position. Right internal jugular
dialysis catheter is unchanged. No pneumothorax or pleural effusion
is noted. Mild bibasilar interstitial densities are noted concerning
for edema or possibly subsegmental atelectasis. The visualized
skeletal structures are unremarkable.
IMPRESSION: Mild bibasilar edema or subsegmental atelectasis.

## 2019-07-11 IMAGING — DX DG CHEST 1V PORT
2 series · 2 of 2 positions shown · non-contrast
Comparison: 06/08/2017

CLINICAL DATA: Respiratory failure

EXAM:
PORTABLE CHEST 1 VIEW

[chest ap (1 of 2)]
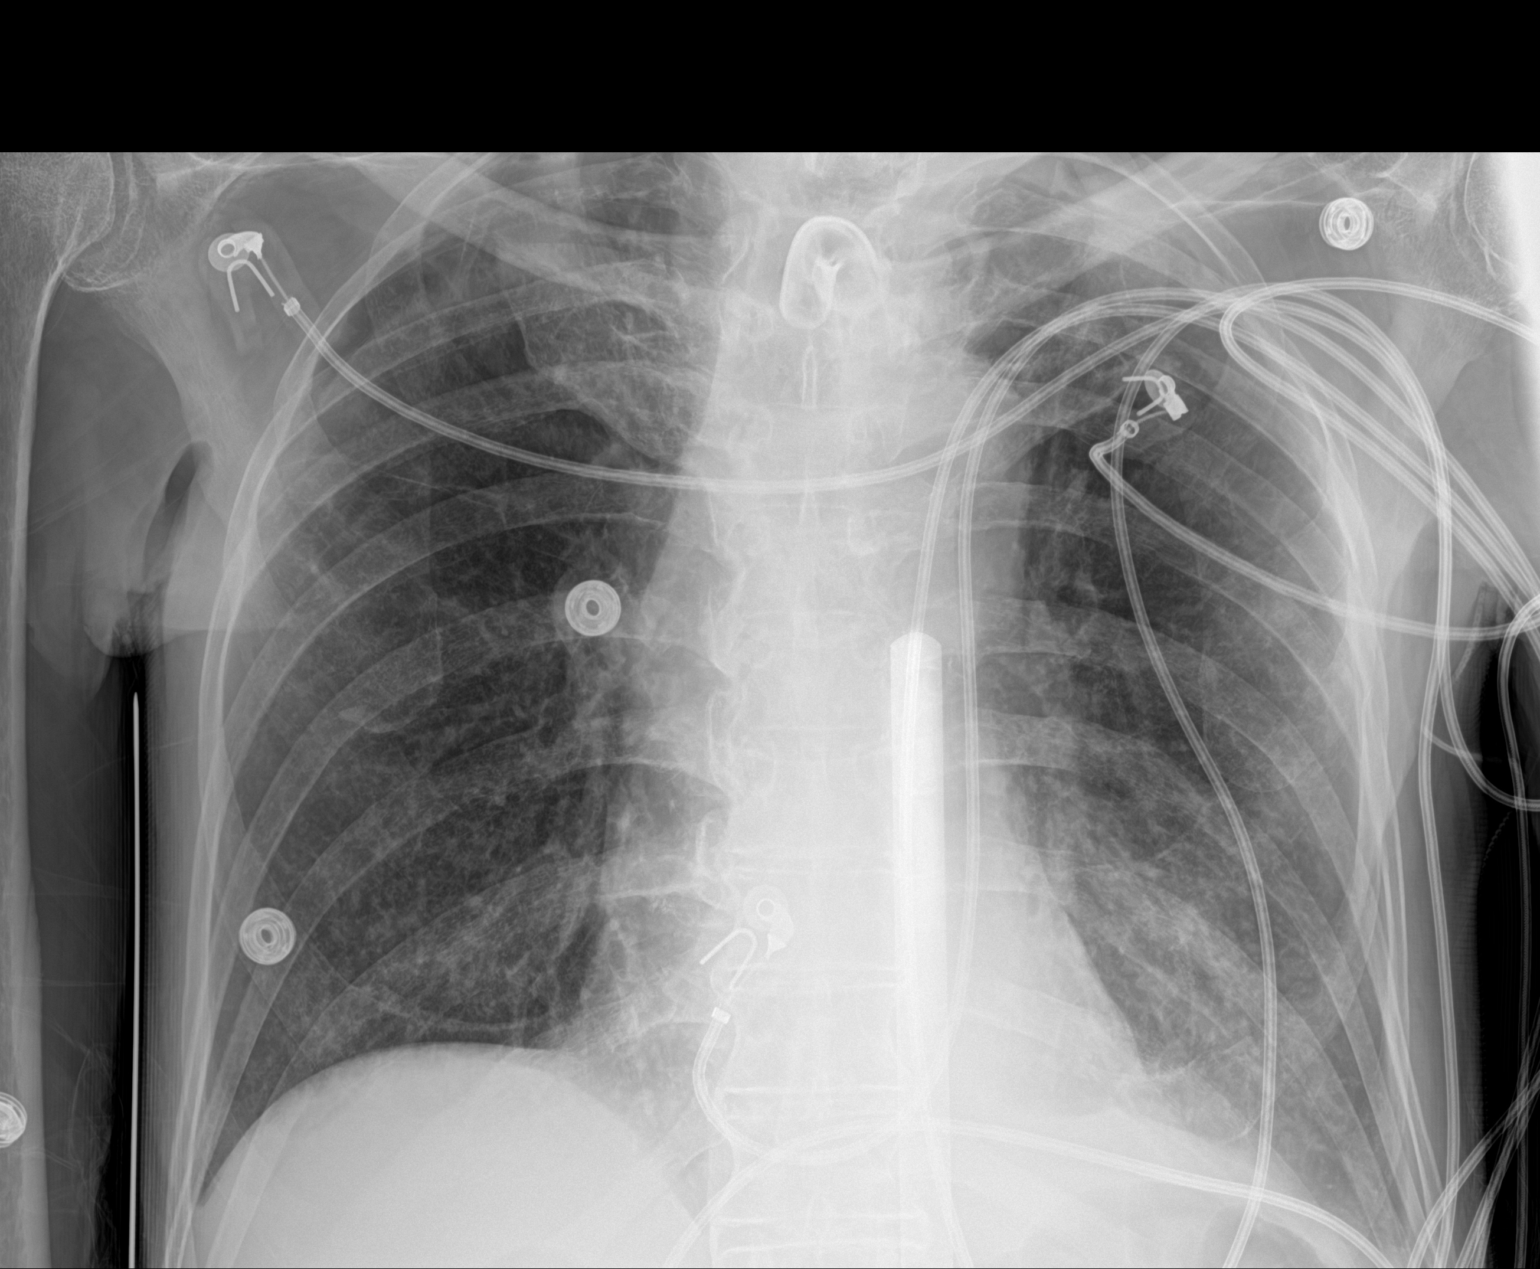

[chest ap (2 of 2)]
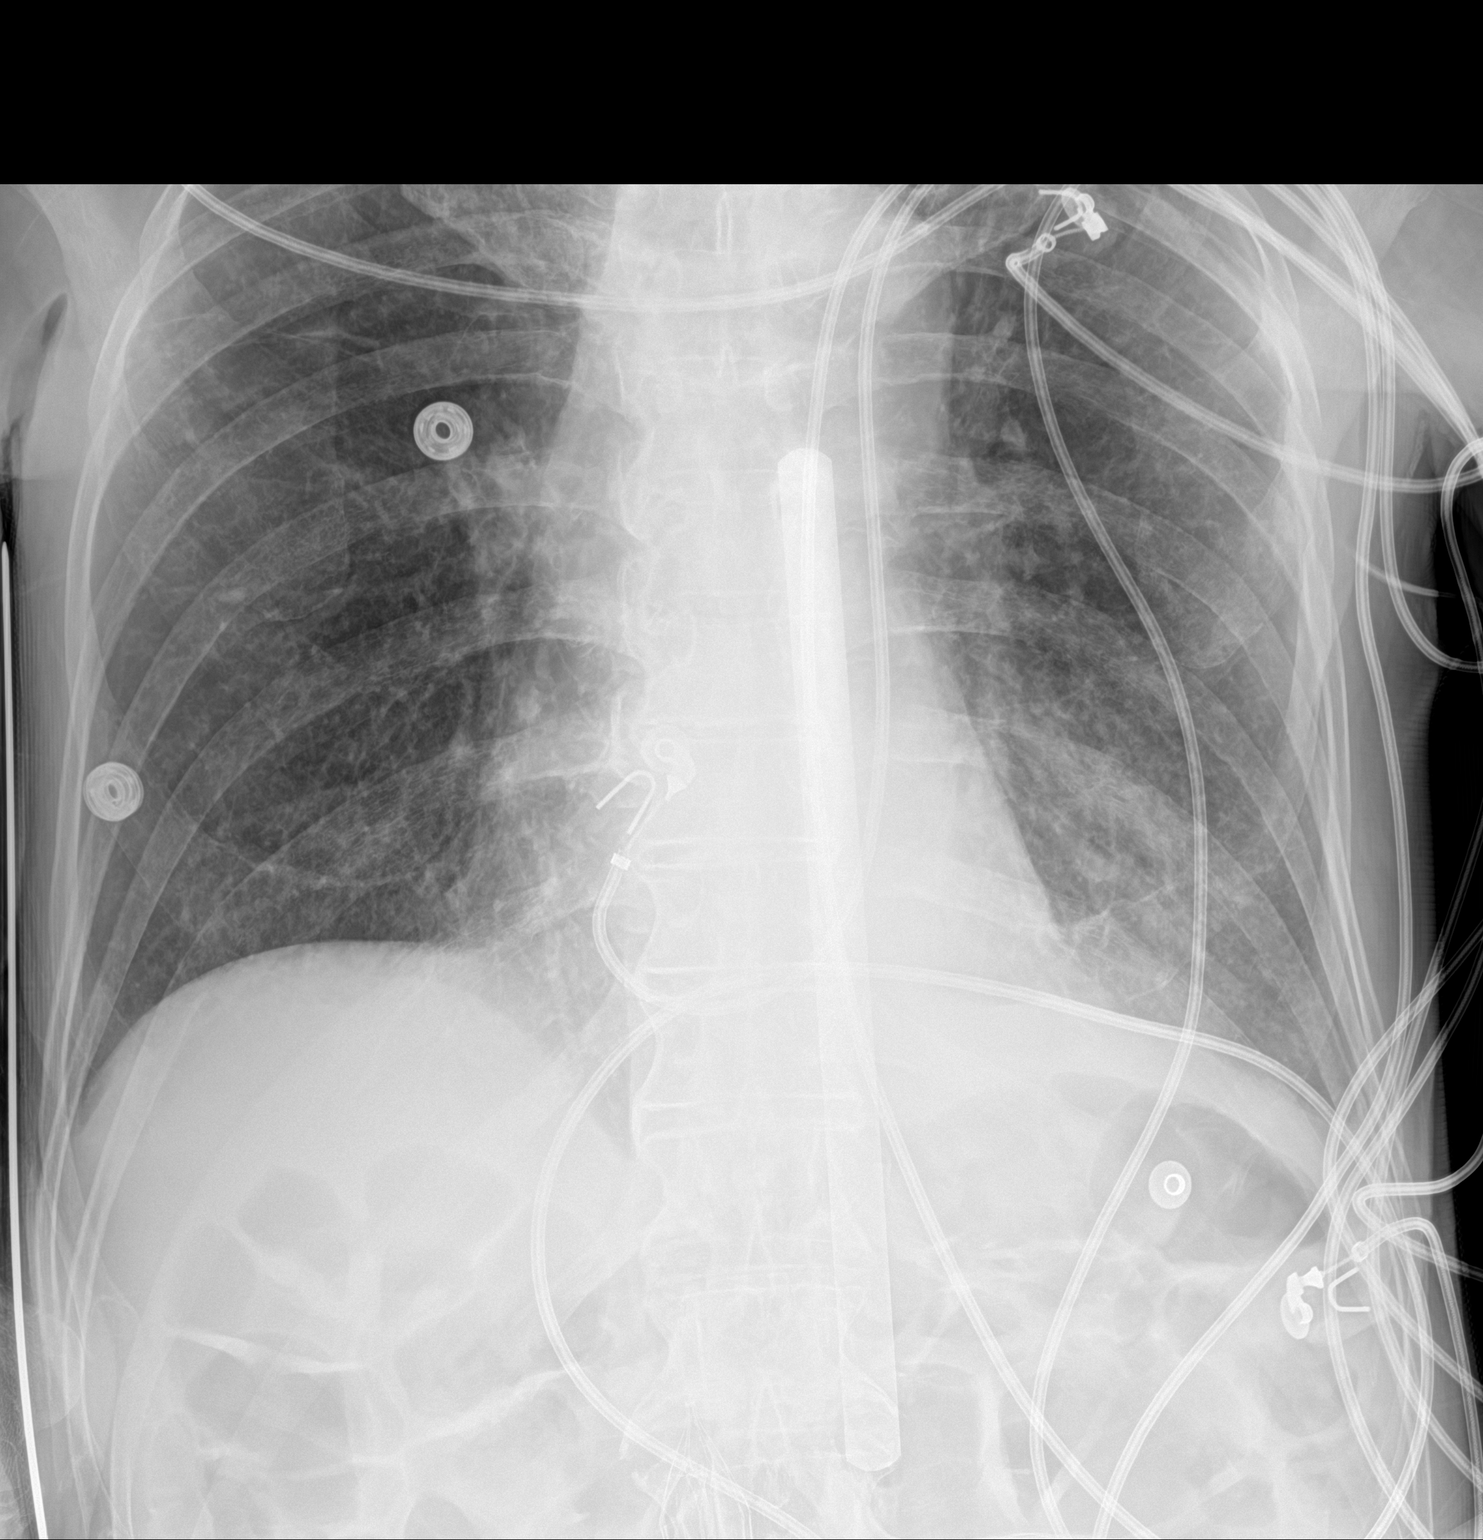

[2 of 2 positions shown; findings below may reference images not displayed]

FINDINGS: Tracheostomy is unchanged. Interval removal of right dialysis
catheter. Heart is normal size. Patchy left infrahilar airspace
opacity. Right lung clear. No effusion or acute bony abnormality.
IMPRESSION: Patchy left infrahilar airspace opacity, question pneumonia.
